# Patient Record
Sex: Female | Born: 1980
Health system: Southern US, Community
[De-identification: ages and names within clinical notes are randomized; demographics above are authoritative.]

## PROBLEM LIST (undated history)

## (undated) ENCOUNTER — Inpatient Hospital Stay (HOSPITAL_COMMUNITY): Payer: Self-pay

## (undated) DIAGNOSIS — I10 Essential (primary) hypertension: Secondary | ICD-10-CM

## (undated) DIAGNOSIS — F419 Anxiety disorder, unspecified: Secondary | ICD-10-CM

## (undated) DIAGNOSIS — IMO0001 Reserved for inherently not codable concepts without codable children: Secondary | ICD-10-CM

## (undated) DIAGNOSIS — F329 Major depressive disorder, single episode, unspecified: Secondary | ICD-10-CM

## (undated) DIAGNOSIS — K219 Gastro-esophageal reflux disease without esophagitis: Secondary | ICD-10-CM

## (undated) DIAGNOSIS — Z5189 Encounter for other specified aftercare: Secondary | ICD-10-CM

## (undated) DIAGNOSIS — Z8619 Personal history of other infectious and parasitic diseases: Secondary | ICD-10-CM

## (undated) DIAGNOSIS — D649 Anemia, unspecified: Secondary | ICD-10-CM

## (undated) DIAGNOSIS — F32A Depression, unspecified: Secondary | ICD-10-CM

## (undated) DIAGNOSIS — N911 Secondary amenorrhea: Secondary | ICD-10-CM

## (undated) HISTORY — DX: Encounter for other specified aftercare: Z51.89

## (undated) HISTORY — DX: Essential (primary) hypertension: I10

## (undated) HISTORY — PX: TUBAL LIGATION: SHX77

## (undated) HISTORY — DX: Secondary amenorrhea: N91.1

## (undated) HISTORY — DX: Depression, unspecified: F32.A

## (undated) HISTORY — DX: Major depressive disorder, single episode, unspecified: F32.9

## (undated) HISTORY — PX: BARTHOLIN CYST MARSUPIALIZATION: SHX5383

## (undated) HISTORY — DX: Anxiety disorder, unspecified: F41.9

---
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
## 2002-01-15 ENCOUNTER — Inpatient Hospital Stay (HOSPITAL_COMMUNITY): Admission: AD | Admit: 2002-01-15 | Discharge: 2002-01-15 | Payer: Self-pay | Admitting: *Deleted

## 2002-01-17 ENCOUNTER — Inpatient Hospital Stay (HOSPITAL_COMMUNITY): Admission: AD | Admit: 2002-01-17 | Discharge: 2002-01-17 | Payer: Self-pay | Admitting: Obstetrics and Gynecology

## 2002-03-30 ENCOUNTER — Inpatient Hospital Stay (HOSPITAL_COMMUNITY): Admission: AD | Admit: 2002-03-30 | Discharge: 2002-03-30 | Payer: Self-pay | Admitting: Obstetrics and Gynecology

## 2002-05-06 ENCOUNTER — Encounter (HOSPITAL_COMMUNITY): Admission: RE | Admit: 2002-05-06 | Discharge: 2002-05-06 | Payer: Self-pay | Admitting: *Deleted

## 2002-05-11 ENCOUNTER — Inpatient Hospital Stay (HOSPITAL_COMMUNITY): Admission: AD | Admit: 2002-05-11 | Discharge: 2002-05-15 | Payer: Self-pay | Admitting: Obstetrics and Gynecology

## 2002-05-17 ENCOUNTER — Inpatient Hospital Stay (HOSPITAL_COMMUNITY): Admission: AD | Admit: 2002-05-17 | Discharge: 2002-05-17 | Payer: Self-pay | Admitting: Obstetrics and Gynecology

## 2006-09-17 ENCOUNTER — Ambulatory Visit: Payer: Self-pay | Admitting: *Deleted

## 2007-07-29 ENCOUNTER — Ambulatory Visit: Payer: Self-pay | Admitting: Family Medicine

## 2007-08-09 ENCOUNTER — Ambulatory Visit: Payer: Self-pay | Admitting: Family Medicine

## 2007-08-09 ENCOUNTER — Ambulatory Visit (HOSPITAL_COMMUNITY): Admission: RE | Admit: 2007-08-09 | Discharge: 2007-08-09 | Payer: Self-pay | Admitting: Family Medicine

## 2007-08-27 ENCOUNTER — Ambulatory Visit: Payer: Self-pay | Admitting: Family Medicine

## 2008-04-14 HISTORY — PX: DENTAL SURGERY: SHX609

## 2009-08-15 ENCOUNTER — Emergency Department (HOSPITAL_COMMUNITY): Admission: EM | Admit: 2009-08-15 | Discharge: 2009-08-15 | Payer: Self-pay | Admitting: Emergency Medicine

## 2009-10-02 ENCOUNTER — Inpatient Hospital Stay (HOSPITAL_COMMUNITY): Admission: AD | Admit: 2009-10-02 | Discharge: 2009-10-02 | Payer: Self-pay | Admitting: Obstetrics & Gynecology

## 2009-10-02 ENCOUNTER — Encounter: Payer: Self-pay | Admitting: Family Medicine

## 2009-10-02 ENCOUNTER — Ambulatory Visit: Payer: Self-pay | Admitting: Obstetrics & Gynecology

## 2009-10-23 ENCOUNTER — Ambulatory Visit (HOSPITAL_COMMUNITY): Admission: RE | Admit: 2009-10-23 | Discharge: 2009-10-23 | Payer: Self-pay | Admitting: Family Medicine

## 2009-10-23 ENCOUNTER — Encounter: Payer: Self-pay | Admitting: Family Medicine

## 2009-11-13 ENCOUNTER — Ambulatory Visit (HOSPITAL_COMMUNITY): Admission: RE | Admit: 2009-11-13 | Discharge: 2009-11-13 | Payer: Self-pay | Admitting: Family Medicine

## 2009-11-28 ENCOUNTER — Encounter: Payer: Self-pay | Admitting: Family Medicine

## 2009-11-28 ENCOUNTER — Ambulatory Visit (HOSPITAL_COMMUNITY): Admission: RE | Admit: 2009-11-28 | Discharge: 2009-11-28 | Payer: Self-pay | Admitting: Family Medicine

## 2010-01-26 ENCOUNTER — Inpatient Hospital Stay (HOSPITAL_COMMUNITY): Admission: AD | Admit: 2010-01-26 | Discharge: 2010-01-26 | Payer: Self-pay | Admitting: Family Medicine

## 2010-01-26 ENCOUNTER — Ambulatory Visit: Payer: Self-pay | Admitting: Advanced Practice Midwife

## 2010-04-18 ENCOUNTER — Inpatient Hospital Stay (HOSPITAL_COMMUNITY)
Admission: AD | Admit: 2010-04-18 | Discharge: 2010-04-18 | Payer: Self-pay | Source: Home / Self Care | Attending: Obstetrics & Gynecology | Admitting: Obstetrics & Gynecology

## 2010-04-19 LAB — CBC
HCT: 32.6 % — ABNORMAL LOW (ref 36.0–46.0)
Hemoglobin: 11 g/dL — ABNORMAL LOW (ref 12.0–15.0)
MCH: 29.6 pg (ref 26.0–34.0)
MCHC: 33.7 g/dL (ref 30.0–36.0)
MCV: 87.6 fL (ref 78.0–100.0)
Platelets: 227 10*3/uL (ref 150–400)
RBC: 3.72 MIL/uL — ABNORMAL LOW (ref 3.87–5.11)
RDW: 13.6 % (ref 11.5–15.5)
WBC: 12.5 10*3/uL — ABNORMAL HIGH (ref 4.0–10.5)

## 2010-04-25 ENCOUNTER — Inpatient Hospital Stay: Admission: RE | Admit: 2010-04-25 | Payer: Self-pay | Source: Home / Self Care | Admitting: Obstetrics and Gynecology

## 2010-04-25 ENCOUNTER — Inpatient Hospital Stay (HOSPITAL_COMMUNITY)
Admission: RE | Admit: 2010-04-25 | Discharge: 2010-04-28 | Payer: Self-pay | Source: Home / Self Care | Attending: Obstetrics and Gynecology | Admitting: Obstetrics and Gynecology

## 2010-04-29 LAB — CBC
HCT: 24.9 % — ABNORMAL LOW (ref 36.0–46.0)
Hemoglobin: 8.4 g/dL — ABNORMAL LOW (ref 12.0–15.0)
MCH: 29.9 pg (ref 26.0–34.0)
MCHC: 33.7 g/dL (ref 30.0–36.0)
MCV: 88.6 fL (ref 78.0–100.0)
Platelets: 173 10*3/uL (ref 150–400)
RBC: 2.81 MIL/uL — ABNORMAL LOW (ref 3.87–5.11)
RDW: 14.1 % (ref 11.5–15.5)
WBC: 12.3 10*3/uL — ABNORMAL HIGH (ref 4.0–10.5)

## 2010-04-29 LAB — SURGICAL PCR SCREEN
MRSA, PCR: NEGATIVE
Staphylococcus aureus: NEGATIVE

## 2010-04-29 LAB — ABO/RH: ABO/RH(D): O POS

## 2010-04-29 LAB — RPR: RPR Ser Ql: NONREACTIVE

## 2010-05-06 NOTE — Discharge Summary (Signed)
  [name], [name]               ACCOUNT NO.:  [id number]  MEDICAL RECORD NO.:  [id number]          PATIENT TYPE:  INP  LOCATION:  9134                          [facility]:  [facility]  PHYSICIAN:  Sherry [name], M.D.   DATE OF BIRTH:  1981/01/12  DATE OF ADMISSION:  04/25/2010 DATE OF DISCHARGE:  04/28/2010                              DISCHARGE SUMMARY   Attending physician at arrival is Dr. Catalina Antigua.  ADMISSION DIAGNOSES: 1. Intrauterine pregnancy at 39 weeks. 2. Previous cesarean section with desire for repeat.  DISCHARGE DIAGNOSES: 1. Status post repeat low transverse cervical cesarean section. 2. Term female infant delivered.  PERTINENT LABS:  Hemoglobin 8.4 and hematocrit 24.9 on April 26, 2010.  BRIEF HOSPITAL COURSE:  This is a 30 year old G2, P 1-0-0-1 at 39 weeks. She was admitted for repeat scheduled low transverse C-section.  The procedure was uncomplicated with EBL of 900 mL.  The patient delivered a viable female infant weighing 8 pounds and 14 ounces with Apgars of 8 and 9 respectively.  The newborn had no complications and will be discharged with the patient on postop day number 3.  Peri and postoperative course was uncomplicated with pain control being achieved on oral Percocet and Motrin.  The patient plans to bottle feed her infant and will receive a Depo-Provera injection prior to discharge for desired contraception. She may pursue more permanent options at her followup appointment.  She will have her staples removed from surgical incision by home health nursing 1-2 days after discharge.  DISCHARGE DISPOSITION:  The patient will be discharged to home in stable medical condition.  DISCHARGE MEDICATIONS: 1. Percocet 5/325 mg p.o. q.4-6 h p.r.n. 2. Motrin 600 mg p.o. q.6 h. p.r.n. 3. Colace 100 mg b.i.d. p.r.n. 4. Iron sulfate 325 mg p.o. daily.  DISCHARGE FOLLOWUP:  [facility]  Attending  physician at the time of discharge, Dr. Tinnie Gens.    ______________________________ Lloyd Huger, MD   ______________________________ Shelbie Proctor. Shawnie [name], M.D.    Cleotis Lema  D:  04/28/2010  T:  04/29/2010  Job:  [id number]  Electronically Signed by Lloyd Huger MD on 05/03/2010 07:22:23 PM Electronically Signed by Tinnie Gens M.D. on 05/06/2010 12:08:35 PM

## 2010-06-26 LAB — URINALYSIS, ROUTINE W REFLEX MICROSCOPIC
Bilirubin Urine: NEGATIVE
Glucose, UA: NEGATIVE mg/dL
Hgb urine dipstick: NEGATIVE
Ketones, ur: NEGATIVE mg/dL
Nitrite: POSITIVE — AB
Protein, ur: 30 mg/dL — AB
Specific Gravity, Urine: 1.025 (ref 1.005–1.030)
Urobilinogen, UA: 0.2 mg/dL (ref 0.0–1.0)
pH: 6 (ref 5.0–8.0)

## 2010-06-26 LAB — URINE MICROSCOPIC-ADD ON

## 2010-06-26 LAB — GLUCOSE, CAPILLARY: Glucose-Capillary: 102 mg/dL — ABNORMAL HIGH (ref 70–99)

## 2010-06-26 LAB — URINE CULTURE
Colony Count: 50000
Culture  Setup Time: 201110152059

## 2010-06-30 LAB — URINALYSIS, ROUTINE W REFLEX MICROSCOPIC
Bilirubin Urine: NEGATIVE
Glucose, UA: NEGATIVE mg/dL
Hgb urine dipstick: NEGATIVE
Ketones, ur: NEGATIVE mg/dL
Nitrite: NEGATIVE
Protein, ur: NEGATIVE mg/dL
Specific Gravity, Urine: >1.03 — ABNORMAL HIGH (ref 1.005–1.030)
Urobilinogen, UA: 0.2 mg/dL (ref 0.0–1.0)
pH: 6 (ref 5.0–8.0)

## 2010-06-30 LAB — WET PREP, GENITAL
Trich, Wet Prep: NONE SEEN
Yeast Wet Prep HPF POC: NONE SEEN

## 2010-06-30 LAB — GC/CHLAMYDIA PROBE AMP, GENITAL
Chlamydia, DNA Probe: NEGATIVE
GC Probe Amp, Genital: NEGATIVE

## 2010-08-27 NOTE — Group Therapy Note (Signed)
NAME:  LORRY, FURBER NO.:  [id number]   MEDICAL RECORD NO.:  [id number]          PATIENT TYPE:  WOC   LOCATION:  [facility]                   FAC[facility]   PHYSICIAN:  Ellis Parents, MD    DATE OF BIRTH:  Apr 23, 1980   DATE OF SERVICE:  09/17/2006                                  CLINIC NOTE   This 30 year old gravida 1, para 1 is referred from [facility] for  evaluation of an asymptomatic Bartholin's duct cyst.  The patient states  that she had developed this cyst approximately 1 year ago, swelling,  some moderate pain in the area but the pain disappeared and the cyst has  remained present but totally asymptomatic.  The patient denies any  anterior dyspareunia and the cyst has not changed in size.   Examination reveals a small Bartholin's duct cyst on the left side,  measuring approximately 2 x 2 cm and nontender and non-nodular and non-  indurated.  The patient was advised if this cyst remains stable in size  and asymptomatic that there is no indication to do any procedures or  operation on this particular cyst.           ______________________________  Ellis Parents, MD     SA/MEDQ  D:  09/17/2006  T:  09/17/2006  Job:  [id number]

## 2010-08-27 NOTE — Group Therapy Note (Signed)
NAME:  Sherry [name], Sherry [name] NO.:  [id number]   MEDICAL RECORD NO.:  [id number]          PATIENT TYPE:  WOC   LOCATION:  [facility]                   [facility]   PHYSICIAN:  Tinnie Gens, MD        DATE OF BIRTH:  Jul 02, 1980   DATE OF SERVICE:  07/29/2007                                  CLINIC NOTE   CHIEF COMPLAINT:  Bartholin's gland cyst.   HISTORY OF PRESENT ILLNESS:  The patient is a 30 year old gravida 1,  para 1 is referred from [facility] for treatment of Bartholin's gland  cyst.  She has previously been seen for this approximately 1 year ago  and it has gotten bigger and more painful, and she would like definitive  treatment for it now.   PAST MEDICAL HISTORY:  Negative.   PAST SURGICAL HISTORY:  C section.   MEDICATIONS:  Multivitamin and Ortho Evra.   ALLERGIES:  None known.  No latex allergy.   OBSTETRICAL HISTORY:  She is a G1 P1 with 1 previous section for arrest  her rest of 7 cm.   GYN HISTORY:  Menarche at age 19.  Cycles are monthly.  Last for 5 days  with mild cramping.  She does use Ortho Evra and condoms for birth  control.  She does have a history of abnormal Pap.  She is status post  colposcopy in April of this year.   FAMILY HISTORY:  Significant for hypertension.   SOCIAL HISTORY:  She lives with her son, Sherry [name].  She is an  occasional alcohol user.  Denies tobacco or other drug use.   14 POINT REVIEW OF SYSTEMS:  Reviewed and is negative.   EXAM:  Today her vitals are as in the chart.  She is a well-nourished female in no acute distress.  ABDOMEN:  Soft, nontender, nondistended.  GU:  Normal vulva and perineum.  She has a 2.5x3.5 cm Bartholin's on the  left.  Not exquisitely tender but this is somewhat tender to palpation.  There is no significant erythema or induration noted.   IMPRESSION:  Left Bartholin's cyst.   PLAN:  Marsupialization in the OR.  Patient discussed risks benefits of  this and agrees to  proceed.           ______________________________  Tinnie Gens, MD     TP/MEDQ  D:  07/29/2007  T:  07/29/2007  Job:  [id number]

## 2010-08-27 NOTE — Op Note (Signed)
NAMEDELORISE, HUNKELE               ACCOUNT NO.:  [id number]   MEDICAL RECORD NO.:  [id number]          PATIENT TYPE:  AMB   LOCATION:  SDC                           [facility]   PHYSICIAN:  Tanya S. Shawnie Pons, M.D.   DATE OF BIRTH:  11-Oct-1980   DATE OF PROCEDURE:  08/09/2007  DATE OF DISCHARGE:                               OPERATIVE REPORT   PREOPERATIVE DIAGNOSIS:  Recurrent Bartholin's gland cyst on the left.   POSTOPERATIVE DIAGNOSIS:  Recurrent Bartholin's gland cyst on the left.   PROCEDURE:  Left Bartholin's marsupialization.   SURGEON:  Shelbie Proctor. Shawnie Pons, M.D.   ASSISTANT:  None.   ANESTHESIA:  MAC and local.   FINDINGS:  Clear fluid-filled cyst.   SPECIMENS:  None.   ESTIMATED BLOOD LOSS:  Minimal.   COMPLICATIONS:  None known.   INDICATIONS FOR PROCEDURE:  The patient is a 30 year old G1, P1 who has  a 1-year history of Bartholin's gland cyst.  She has had an I&D with the  Word catheter several times, but this has not prevented recurrence.  It  has gotten larger and more painful, and she desires definitive  treatment.   DESCRIPTION OF PROCEDURE:  The patient was taken to the OR.  She was  placed in dorsal lithotomy in Fouke stirrups.  She was prepped and  draped in the usual sterile fashion.  3 mL of 1% lidocaine plain was  then injected over the Bartholin's gland on the labia minora, so as to  not be on the perineum where the patient sits.   A 1-cm incision was made with the knife and carried down to the  underlying cyst wall which was incised.  The cyst wall was then sutured  to the labia minora in a ring fashion.  Excellent hemostasis was noted.  More clear fluid was drained out of the cyst as well as some mucousy-  type discharge that was noted.  All of this was drained.  The  Bartholin's was flat and the labia flat at the end of the procedure.  All instrument, needle, and lap counts were correct x2.   The patient was awakened and taken to the recovery  room in stable  condition.      Shelbie Proctor. Shawnie Pons, M.D.  Electronically Signed     TSP/MEDQ  D:  08/09/2007  T:  08/09/2007  Job:  [id number]

## 2010-08-30 NOTE — Op Note (Signed)
NAME:  Sherry [name], Sherry [name]                         ACCOUNT NO.:  [id number]   MEDICAL RECORD NO.:  [id number]                   PATIENT TYPE:  INP   LOCATION:  9146                                 [facility]   PHYSICIAN:  Mary Sella. Orlene Erm, M.D.                 DATE OF BIRTH:  [birth year]   DATE OF PROCEDURE:  DATE OF DISCHARGE:  05/12/2002                                 OPERATIVE REPORT   PREOPERATIVE DIAGNOSIS:  A 30 year old, gravida 1, para 0 at 41 weeks  intrauterine pregnancy with arrest of dilatation.   POSTOPERATIVE DIAGNOSIS:  A 30 year old, gravida 1, para 0 at 41 weeks  intrauterine pregnancy with arrest of dilatation.   PROCEDURE:  Primary low transverse cesarean section.   SURGEON:  Enid Cutter, M.D.   ANESTHESIA:  Epidural.   COMPLICATIONS:  None.   SPECIMENS:  Cord blood.   FINDINGS:  Viable female infant delivered at 2046, Apgars 8 and 9, weighing 7  pounds 13 ounces.  Normal uterus, tubes  ovaries.   ESTIMATED BLOOD LOSS:  800 mL.   DISPOSITION:  The patient went to the recovery room stable.   INDICATIONS FOR PROCEDURE:  The patient is a 30 year old gravida 1, para 0  at 51 weeks' gestation who was begun on induction secondary to post dates.  She underwent induction with Cervidil and progressed to 3 cm.  Artificial  rupture of membranes was performed, and the patient was given Pitocin.  She  progressed to 4 cm and an IUPC was placed.  The Pitocin was increased to  adequate labor.  She progressed to 6-7 cm very slowly throughout the day.  She finally arrested at 6-7 cm after multiple hours of adequate labor.  The  patient was counseled on risks of cesarean section, including risks of  bleeding and damage to internal organs, transfusions or need for emergent  hysterectomy.  The patient understands these risks and wishes to proceed.   DESCRIPTION OF PROCEDURE:  The patient was taken to the operating room where  her epidural anesthesia was bolused.  She was  prepped and draped in the  usual sterile fashion.  A Pfannenstiel incision was performed with the  scalpel and carried to the underlying fascia.  The fascia was entered  sharply and dissected laterally with Mayo scissors.  The fascia was  separated from the underlying rectus muscle bellies with sharp and blunt  dissection.  The peritoneum was entered bluntly and stretched with the  surgeon's fingers.  The bladder blade was placed.  The bladder flap was  created with sharp and blunt dissection.  A scalpel was used to score the  uterus and the incision was carried down to the midline with a scalpel.  The  surgeon's fingers were easily entered into the uterine cavity, and the  incision was extended with the surgeon's finger's.  The infant's head was  then delivered through  the uterine incision and bulb suctioned on the  operative field.  The infant's body was delivered, and the cord was clamped  and cut.  The infant was handed off to the awaiting neonatal resuscitation  team.  The uterus was massaged, and the placenta was extracted. The uterus  was curetted with a dry lap sponge.  The uterine incision was repaired with  a running locking stitch of 0 chromic.  A second imbricating layered suture  was performed.  The uterine incision was inspected and noted to be  hemostatic.  The uterus was returned to the abdominal cavity and pericolic  gutters cleared of clot and debris.  The uterine incision was once again  noted to be hemostatic.  The fascia was then closed with a running suture of  0 Vicryl.  The subcutaneous tissues were irrigated with copious amounts of  saline and the subcutaneous tissues were reapproximated with staples.  The  patient tolerated the procedure well and returned to the recovery room in  stable condition.  Sponge, instrument and needle counts were correct at the  end of the procedure.                                               Mary Sella. Orlene Erm, M.D.    EMH/MEDQ  D:   05/12/2002  T:  05/13/2002  Job:  [id number]

## 2010-08-30 NOTE — Discharge Summary (Signed)
NAME:  Sherry [name], Sherry [name] NO.:  [id number]   MEDICAL RECORD NO.:  [id number]                   PATIENT TYPE:   LOCATION:                                       FACILITY:   PHYSICIAN:  Phil D. Okey Dupre, M.D.                  DATE OF BIRTH:  1980-12-07   DATE OF ADMISSION:  05/11/2002  DATE OF DISCHARGE:  05/15/2002                                 DISCHARGE SUMMARY   DISCHARGE DIAGNOSES:  1. Intrauterine pregnancy at 41 weeks - delivered viable female infant.  2. Induction of labor for post term.  3. Status post low transverse cesarean section.  4. Anemia with a postoperative hemoglobin of 8.9 on discharge.  5. Depression.   DISCHARGE MEDICATIONS:  1. Iron 325 mg p.o. q. daily.  2. Depo-Provera 150 mg IM times one given prior to discharge.  3. Celexa 20 mg p.o. q. daily.   PROCEDURES WHILE IN HOSPITAL:  1. Low transverse cesarean section performed by Dr. Shearon Balo.  For full     dictation of that please see his note.   FOLLOW UP:  The patient is to follow-up in two days at the [facility] for staple  removal and then in six weeks at [facility].   HOSPITAL COURSE AND PRESENTING ILLNESS:  This is a 30 year-old [place] at  41 weeks and 0 days gestation who presented for induction of labor secondary  to post dates.  The pregnancy was dated by an 18 week ultrasound and last  menstrual period.  The pregnancy was followed at [facility] with onset of  care at 15 weeks and was complicated by depression and initial Chlamydia  positive on presentation.   MEDICATIONS:  1. Prenatal vitamins.  2. Celexa.   PAST MEDICAL HISTORY:  1. Depression.  2. Goiter surgically removed.   PRENATAL LABORATORIES:  The patient was blood type O positive and antibody  negative.  Initial hematocrit was 32.2 and platelets of 264,000.  The  patient was [place] non-immune.  Hepatitis B surface antigen was negative.  Syphilis, HIV and GC all negative.  Chlamydia initially  positive  in July  2003 and negative in November 2003.  The patient was GBS negative on  February 16, 2002 when RTT was 55.   PHYSICAL EXAMINATION ON PRESENTATION:  GENERAL:  Generally pleasant and in  no acute distress.  Physical examination was within normal limits.  PELVIC:  Digital cervical exam was 1 cm, 50% and -2.  Fetal heart tones at  baseline was 140's, reactive with good variability and occasional variable  decelerations.  The patient was contracting every two to four minutes.  Therefore, the patient was admitted for induction of labor.   HOSPITAL COURSE:  Labor was induced with Cervidil which was placed on  January 28 and then on the morning of January 29 the patient was begun on  Pitocin  and on January 29 at 7:40 p.m. got to a dilation of 6 cm and did not  dilate any further.  Therefore, it was decided due to arrestive descent the  patient was brought back for a low transverse cesarean section and gave  birth to a female infant weighing 7 pounds 13 ounces with Apgars of 8 and 9.  The patient had an uncomplicated postoperative course and was discharged  home on May 15, 2002.     Bradly Bienenstock, M.D.                         Phil D. Okey Dupre, M.D.    Arliss Journey  D:  05/15/2002  T:  05/15/2002  Job:  [id number]

## 2011-01-07 LAB — PREGNANCY, URINE: Preg Test, Ur: NEGATIVE

## 2011-01-07 LAB — CBC
HCT: 37.3
Hemoglobin: 12.9
MCHC: 34.5
MCV: 88.9
Platelets: 274
RBC: 4.19
RDW: 13
WBC: 9.2

## 2012-08-19 ENCOUNTER — Encounter: Payer: Self-pay | Admitting: Family Medicine

## 2012-08-19 ENCOUNTER — Ambulatory Visit (INDEPENDENT_AMBULATORY_CARE_PROVIDER_SITE_OTHER): Payer: 59 | Admitting: Family Medicine

## 2012-08-19 VITALS — BP 140/80 | Temp 98.2°F | Ht 66.0 in | Wt 183.0 lb

## 2012-08-19 DIAGNOSIS — F418 Other specified anxiety disorders: Secondary | ICD-10-CM

## 2012-08-19 DIAGNOSIS — IMO0001 Reserved for inherently not codable concepts without codable children: Secondary | ICD-10-CM

## 2012-08-19 DIAGNOSIS — Z7189 Other specified counseling: Secondary | ICD-10-CM

## 2012-08-19 DIAGNOSIS — Z7689 Persons encountering health services in other specified circumstances: Secondary | ICD-10-CM

## 2012-08-19 DIAGNOSIS — Z309 Encounter for contraceptive management, unspecified: Secondary | ICD-10-CM

## 2012-08-19 DIAGNOSIS — F341 Dysthymic disorder: Secondary | ICD-10-CM

## 2012-08-19 MED ORDER — CITALOPRAM HYDROBROMIDE 20 MG PO TABS: 20.0000 mg | ORAL_TABLET | Freq: Every day | ORAL | Status: DC

## 2012-08-19 MED ORDER — NORELGESTROMIN-ETH ESTRADIOL 150-35 MCG/24HR TD PTWK: 1.0000 | MEDICATED_PATCH | TRANSDERMAL | Status: DC

## 2012-08-19 NOTE — Progress Notes (Signed)
Chief Complaint  Patient presents with  . Establish Care    HPI:  Sherry [name] is here to establish care. [facility] gyn but decided to get PCP when got insurance. Last PCP and physical: Feb 2013 - normal, had abnormal in the past, but all normal since.  Has the following chronic problems and concerns today:  There are no active problems to display for this patient.  Depression and Anxiety: -depressed mood almost every day with some generalized anxiety as well -no recent bad situations, but work is a little stressful -no thoughts of suicide or self harm -a long time ago was in hospital briefly for depression as a teenager due to thoughs of self harm - none since -was on celexa and this was controlling her symptoms, but ran out a few months ago and has tried calling the psychiatrist at the health department, but they have not gotten back to her -she will call to start counseling back up  Contraception: -used to use ortho-evra - but ran out -wants to restart -periods are regular and normal -FDLMP: 07/28/12  Health Maintenance:  ROS: See pertinent positives and negatives per HPI.  Past Medical History  Diagnosis Date  . Depression     Family History  Problem Relation Age of Onset  . Diabetes Mother   . Hypertension Mother   . Depression Father   . Diabetes Maternal Aunt     History   Social History  . Marital Status: Married    Spouse Name: N/A    Number of Children: N/A  . Years of Education: N/A   Social History Main Topics  . Smoking status: Never Smoker   . Smokeless tobacco: None  . Alcohol Use: Yes     Comment: occ - 1 - 2 drinks every other weekend  . Drug Use: No  . Sexually Active: Yes     Comment: with husband   Other Topics Concern  . 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: living with husband and son 20 and daughter 2 (in  2014)      Spiritual Beliefs: christian      Lifestyle: no regular exercise, diet is not good             Current outpatient prescriptions:Multiple Vitamins-Minerals (ALIVE WOMENS ENERGY PO), Take 1 tablet by mouth daily., Disp: , Rfl: ;  citalopram (CELEXA) 20 MG tablet, Take 1 tablet (20 mg total) by mouth daily., Disp: 30 tablet, Rfl: 3;  norelgestromin-ethinyl estradiol (ORTHO EVRA) 150-20 MCG/24HR transdermal patch, Place 1 patch onto the skin once a week., Disp: 3 patch, Rfl: 12  EXAM:  Filed Vitals:   08/19/12 1603  BP: 140/80  Temp: 98.2 F (36.8 C)    Body mass index is 29.55 kg/(m^2).  GENERAL: vitals reviewed and listed above, alert, oriented, appears well hydrated and in no acute distress  HEENT: atraumatic, conjunttiva clear, no obvious abnormalities on inspection of external nose and ears  NECK: no obvious masses on inspection  LUNGS: clear to auscultation bilaterally, no wheezes, rales or rhonchi, good air movement  CV: HRRR, no peripheral edema  MS: moves all extremities without noticeable abnormality  PSYCH: pleasant and cooperative, no obvious depression or anxiety  ASSESSMENT AND PLAN:  Discussed the following assessment and plan:  Depression with anxiety - Plan: citalopram (CELEXA) 20 MG tablet  Contraception - Plan: norelgestromin-ethinyl estradiol (ORTHO EVRA) 150-20 MCG/24HR transdermal patch  Encounter to establish care - Plan: Lipid Panel, Hemoglobin A1c -We reviewed the PMH, PSH, FH, SH, Meds and Allergies. -We provided refills for any medications we will prescribe as needed. -We addressed current concerns per orders and patient instructions. -advised counseling and will restart celexa for depression/anxiety - discussed risks -urine preg neg - quick start restart ortho-evra - risks discussed -NON-FASTING LABS -follow up for physical with pap in 1 month   -Patient advised to return or notify a doctor immediately if symptoms worsen or  persist or new concerns arise.  Patient Instructions  -We have ordered labs or studies at this visit. It can take up to 1-2 weeks for results and processing. We will contact you with instructions IF your results are abnormal. Normal results will be released to your [facility]. If you have not heard from [place] or can not find your results in [facility] in 2 weeks please contact our office.  -PLEASE SIGN UP FOR MYCHART TODAY   -As we discussed, we have prescribed a new medication for you at this appointment. We discussed the common and serious potential adverse effects of this medication and you can review these and more with the pharmacist when you pick up your medication.  Please follow the instructions for use carefully and notify us immediately if you have any problems taking this medication.  We recommend the following healthy lifestyle measures: - eat a healthy diet consisting of lots of vegetables, fruits, beans, nuts, seeds, healthy meats such as white chicken and fish and whole grains.  - avoid fried foods, fast food, processed foods, sodas, red meet and other fattening foods.  - get a least 150 minutes of aerobic exercise per week.   Follow up in: 1 month for physical with pap smear      KIM, HANNAH R.

## 2012-08-19 NOTE — Patient Instructions (Signed)
-  We have ordered labs or studies at this visit. It can take up to 1-2 weeks for results and processing. We will contact you with instructions IF your results are abnormal. Normal results will be released to your [facility]. If you have not heard from [place] or can not find your results in [facility] in 2 weeks please contact our office.  -PLEASE SIGN UP FOR MYCHART TODAY   -As we discussed, we have prescribed a new medication for you at this appointment. We discussed the common and serious potential adverse effects of this medication and you can review these and more with the pharmacist when you pick up your medication.  Please follow the instructions for use carefully and notify us immediately if you have any problems taking this medication.  We recommend the following healthy lifestyle measures: - eat a healthy diet consisting of lots of vegetables, fruits, beans, nuts, seeds, healthy meats such as white chicken and fish and whole grains.  - avoid fried foods, fast food, processed foods, sodas, red meet and other fattening foods.  - get a least 150 minutes of aerobic exercise per week.   Follow up in: 1 month for physical with pap smear

## 2012-09-16 ENCOUNTER — Other Ambulatory Visit (HOSPITAL_COMMUNITY)
Admission: RE | Admit: 2012-09-16 | Discharge: 2012-09-16 | Disposition: A | Discharge status: Home / Self Care | Payer: 59 | Source: Ambulatory Visit | Attending: Family Medicine | Admitting: Family Medicine

## 2012-09-16 ENCOUNTER — Ambulatory Visit (INDEPENDENT_AMBULATORY_CARE_PROVIDER_SITE_OTHER): Payer: 59 | Admitting: Family Medicine

## 2012-09-16 ENCOUNTER — Encounter: Payer: Self-pay | Admitting: Family Medicine

## 2012-09-16 VITALS — BP 126/86 | Temp 98.2°F | Wt 177.0 lb

## 2012-09-16 DIAGNOSIS — A64 Unspecified sexually transmitted disease: Secondary | ICD-10-CM

## 2012-09-16 DIAGNOSIS — IMO0002 Reserved for concepts with insufficient information to code with codable children: Secondary | ICD-10-CM

## 2012-09-16 DIAGNOSIS — Z0000 Encounter for general adult medical examination without abnormal findings: Secondary | ICD-10-CM

## 2012-09-16 DIAGNOSIS — Z01419 Encounter for gynecological examination (general) (routine) without abnormal findings: Secondary | ICD-10-CM | POA: Insufficient documentation

## 2012-09-16 DIAGNOSIS — Z309 Encounter for contraceptive management, unspecified: Secondary | ICD-10-CM

## 2012-09-16 DIAGNOSIS — Z131 Encounter for screening for diabetes mellitus: Secondary | ICD-10-CM

## 2012-09-16 DIAGNOSIS — F341 Dysthymic disorder: Secondary | ICD-10-CM

## 2012-09-16 DIAGNOSIS — Z13220 Encounter for screening for lipoid disorders: Secondary | ICD-10-CM

## 2012-09-16 DIAGNOSIS — Z1151 Encounter for screening for human papillomavirus (HPV): Secondary | ICD-10-CM | POA: Insufficient documentation

## 2012-09-16 DIAGNOSIS — F418 Other specified anxiety disorders: Secondary | ICD-10-CM

## 2012-09-16 DIAGNOSIS — IMO0001 Reserved for inherently not codable concepts without codable children: Secondary | ICD-10-CM

## 2012-09-16 DIAGNOSIS — R87810 Cervical high risk human papillomavirus (HPV) DNA test positive: Secondary | ICD-10-CM | POA: Insufficient documentation

## 2012-09-16 DIAGNOSIS — Z113 Encounter for screening for infections with a predominantly sexual mode of transmission: Secondary | ICD-10-CM | POA: Insufficient documentation

## 2012-09-16 DIAGNOSIS — N760 Acute vaginitis: Secondary | ICD-10-CM | POA: Insufficient documentation

## 2012-09-16 LAB — LIPID PANEL
HDL: 49.3 mg/dL (ref >39.00)
Total CHOL/HDL Ratio: 3
Triglycerides: 133 mg/dL (ref 0.0–149.0)
VLDL: 26.6 mg/dL (ref 0.0–40.0)

## 2012-09-16 LAB — HEMOGLOBIN A1C: Hgb A1c MFr Bld: 5.3 % (ref 4.6–6.5)

## 2012-09-16 MED ORDER — CITALOPRAM HYDROBROMIDE 40 MG PO TABS: 40.0000 mg | ORAL_TABLET | Freq: Every day | ORAL | Status: DC

## 2012-09-16 NOTE — Progress Notes (Signed)
Chief Complaint  Patient presents with  . Annual Exam    HPI:  Here for CPE:  -Concerns today:  1)Depression: -started celexa last visit -feeling much better over all, does have a few days a week still where feels a bit doen if does not keep busy  2)Contrception -started orthoevra last visit -discussed potential for decreased effectives with antidepressant and need for condoms  -Diet: variety of foods, balance and well rounded  -Taking folic acid: taking multivit with folic acid  -Exercise: no regular exercise  -Diabetes and Dyslipidemia Screening: did not do last visit  -Hx of HTN: no  -Vaccines: UTD  -pap history: 05/2011 per report - normal  -FDLMP: about 1 month ago  -sexual activity: yes, female partner, no new partners  -wants STI testing: yes, no new partners or concerns  -does self breast exams  -Alcohol, Tobacco, drug use: see social history  Review of Systems - CP, SOB, DOE, palpitations, dizziness, change sin bowels, dysuria, vaginal discharge, abnormal bleeding, rashes, weight loss, fevers  Past Medical History  Diagnosis Date  . Depression     Family History  Problem Relation Age of Onset  . Diabetes Mother   . Hypertension Mother   . Depression Father   . Diabetes Maternal Aunt     History   Social History  . Marital Status: Married    Spouse Name: N/A    Number of Children: N/A  . Years of Education: N/A   Social History Main Topics  . Smoking status: Never Smoker   . Smokeless tobacco: None  . Alcohol Use: Yes     Comment: occ - 1 - 2 drinks every other weekend  . Drug Use: No  . Sexually Active: Yes     Comment: with husband   Other Topics Concern  . 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: living with husband and son 63 and daughter 2 (in 2014)      Spiritual Beliefs: christian      Lifestyle: no regular exercise, diet is not good              Current outpatient prescriptions:citalopram (CELEXA) 40 MG tablet, Take 1 tablet (40 mg total) by mouth daily., Disp: 30 tablet, Rfl: 3;  Multiple Vitamins-Minerals (ALIVE WOMENS ENERGY PO), Take 1 tablet by mouth daily., Disp: , Rfl: ;  norelgestromin-ethinyl estradiol (ORTHO EVRA) 150-20 MCG/24HR transdermal patch, Place 1 patch onto the skin once a week., Disp: 3 patch, Rfl: 12  EXAM:  Filed Vitals:   09/16/12 0943  BP: 126/86  Temp: 98.2 F (36.8 C)    GENERAL: vitals reviewed and listed below, alert, oriented, appears well hydrated and in no acute distress  HEENT: head atraumatic, PERRLA, normal appearance of eyes, ears, nose and mouth. moist mucus membranes.  NECK: supple, no masses or lymphadenopathy  LUNGS: clear to auscultation bilaterally, no rales, rhonchi or wheeze  CV: HRRR, no peripheral edema or cyanosis, normal pedal pulses  BREAST: normal appearance - no lesions or discharge, on palpation normal breast tissue without any suspicious masses  ABDOMEN: bowel sounds normal, soft, non tender to palpation, no masses, no rebound or guarding  GU: normal appearance of external genitalia - no lesions or masses, normal vaginal mucosa - no abnormal discharge, normal appearance of cervix - no lesions or abnormal discharge, no masses or tenderness on palpation of uterus and ovaries.  RECTAL: refused  SKIN: no rash or abnormal lesions  MS: normal gait, moves all extremities normally  NEURO: CN II-XII grossly intact, normal muscle strength and sensation to light touch on extremities  PSYCH: normal affect, pleasant and cooperative  ASSESSMENT AND PLAN:  Discussed the following assessment and plan:  Depression with anxiety - Plan: citalopram (CELEXA) 40 MG tablet -wants to try increasing dose of celexa - risks/interactions discussed  Encounter for preventive health examination  Contraception  Diabetes mellitus screening - Plan: Hemoglobin A1c  Screening for  hyperlipidemia - Plan: Lipid Panel  Venereal disease screening - Plan: HIV Antibody, RPR, GC/Chlam, HIV, RPR, trich  -Discussed and advised all [place] preventive services health task force level A and B recommendations for age, sex and risks.  -NON FASTING LABS today  -Advised at least 150 minutes of exercise per week and a healthy diet low in saturated fats and sweets and consisting of fresh fruits and vegetables, lean meats such as fish and white chicken and whole grains.  -labs, studies and vaccines per orders this encounter  Orders Placed This Encounter  Procedures  . Lipid Panel  . Hemoglobin A1c  . HIV Antibody  . RPR    Patient Instructions  -We have ordered labs or studies at this visit. It can take up to 1-2 weeks for results and processing. We will contact you with instructions IF your results are abnormal. Normal results will be released to your [facility]. If you have not heard from [place] or can not find your results in [facility] in 2 weeks please contact our office.  We recommend the following healthy lifestyle measures: - eat a healthy diet consisting of lots of vegetables, fruits, beans, nuts, seeds, healthy meats such as white chicken and fish and whole grains.  - avoid fried foods, fast food, processed foods, sodas, red meet and other fattening foods.  - get a least 150 minutes of aerobic exercise per week.   -reschedule appointment with counselor  -increase celexa to 40mg  daily - use condoms with sex to prevent pregnancy  Follow up in: 1-2 months     Patient advised to return to clinic immediately if symptoms worsen or persist or new concerns.  @LIFEPLAN @  No Follow-up on file.  Sherry Basque R.

## 2012-09-16 NOTE — Patient Instructions (Addendum)
-  We have ordered labs or studies at this visit. It can take up to 1-2 weeks for results and processing. We will contact you with instructions IF your results are abnormal. Normal results will be released to your [facility]. If you have not heard from [place] or can not find your results in [facility] in 2 weeks please contact our office.  We recommend the following healthy lifestyle measures: - eat a healthy diet consisting of lots of vegetables, fruits, beans, nuts, seeds, healthy meats such as white chicken and fish and whole grains.  - avoid fried foods, fast food, processed foods, sodas, red meet and other fattening foods.  - get a least 150 minutes of aerobic exercise per week.   -reschedule appointment with counselor  -increase celexa to 40mg  daily - use condoms with sex to prevent pregnancy  Follow up in: 1-2 months

## 2012-09-17 NOTE — Progress Notes (Signed)
Quick Note:  Left a detailed message for pt at designated cell phone number. ______ 

## 2012-09-24 NOTE — Progress Notes (Signed)
Quick Note:  Attempted to call pt; not available per operator. ______

## 2012-09-27 NOTE — Progress Notes (Signed)
Quick Note:  Spoke with pt and pt is aware. ______ 

## 2012-09-27 NOTE — Addendum Note (Signed)
Addended by: Azucena Freed on: 09/27/2012 03:51 PM   Modules accepted: Orders

## 2012-09-27 NOTE — Progress Notes (Signed)
Please continue to try to contact. If you can not reach her please send letter that pap was abnormal and that she needs to see a gynecologist. Please let her know referral placed and place referral to gyn for abnormal pap. Thanks.

## 2012-09-27 NOTE — Progress Notes (Signed)
Quick Note:  Left a message for pt to return call. Letter mailed to pt's home address. ______

## 2012-09-27 NOTE — Progress Notes (Signed)
Spoke with pt and pt is aware of results. Please place referral

## 2012-10-14 ENCOUNTER — Ambulatory Visit: Payer: 59 | Admitting: Family Medicine

## 2012-10-19 ENCOUNTER — Ambulatory Visit (INDEPENDENT_AMBULATORY_CARE_PROVIDER_SITE_OTHER): Payer: 59 | Admitting: Gynecology

## 2012-10-19 ENCOUNTER — Ambulatory Visit (INDEPENDENT_AMBULATORY_CARE_PROVIDER_SITE_OTHER): Payer: 59 | Admitting: Family Medicine

## 2012-10-19 ENCOUNTER — Encounter: Payer: Self-pay | Admitting: Family Medicine

## 2012-10-19 ENCOUNTER — Ambulatory Visit: Payer: 59 | Admitting: Family Medicine

## 2012-10-19 ENCOUNTER — Encounter: Payer: Self-pay | Admitting: Gynecology

## 2012-10-19 VITALS — BP 112/76 | HR 76 | Resp 12 | Ht 66.5 in | Wt 172.0 lb

## 2012-10-19 VITALS — BP 122/82 | Temp 98.6°F | Wt 173.0 lb

## 2012-10-19 DIAGNOSIS — B372 Candidiasis of skin and nail: Secondary | ICD-10-CM | POA: Insufficient documentation

## 2012-10-19 DIAGNOSIS — F341 Dysthymic disorder: Secondary | ICD-10-CM

## 2012-10-19 DIAGNOSIS — Z0000 Encounter for general adult medical examination without abnormal findings: Secondary | ICD-10-CM

## 2012-10-19 DIAGNOSIS — F418 Other specified anxiety disorders: Secondary | ICD-10-CM

## 2012-10-19 DIAGNOSIS — IMO0002 Reserved for concepts with insufficient information to code with codable children: Secondary | ICD-10-CM | POA: Insufficient documentation

## 2012-10-19 DIAGNOSIS — R6889 Other general symptoms and signs: Secondary | ICD-10-CM

## 2012-10-19 DIAGNOSIS — Z01419 Encounter for gynecological examination (general) (routine) without abnormal findings: Secondary | ICD-10-CM

## 2012-10-19 LAB — POCT URINALYSIS DIPSTICK
Blood, UA: 2
pH, UA: 6

## 2012-10-19 MED ORDER — CITALOPRAM HYDROBROMIDE 40 MG PO TABS: 40.0000 mg | ORAL_TABLET | Freq: Every day | ORAL | Status: DC

## 2012-10-19 MED ORDER — NYSTATIN-TRIAMCINOLONE 100000-0.1 UNIT/GM-% EX OINT: TOPICAL_OINTMENT | Freq: Two times a day (BID) | CUTANEOUS | Status: DC

## 2012-10-19 NOTE — Progress Notes (Signed)
32 y.o. Married [place] American female   [phone] here for annual exam. Pt is currently sexually active.  Pt has been referred from PCP for evaluation of ASCUS,+HRHPV,  Pt reports current partner for 3.5y, using ortho-evra patch for contraception.  Pt reports having colposcopy 7-8y ago, everything normal, PAP's normal since.  Last PAP 10/2008, normal. Pt denies post-coital bleeding, dyspareunia, dysmenorrhea.   Patient's last menstrual period was 10/15/2012.          Sexually active: yes  The current method of family planning is Ortho-Evra patches weekly.    Exercising: yes  walking on her job Last pap: 6/14- ASCUS + HPV Alcohol: no  Tobacco: no BSE: every now and then   Urine: Blood 2   Health Maintenance  Topic Date Due  . Influenza Vaccine  12/13/2012  . Pap Smear  09/17/2015  . Tetanus/tdap  11/16/2017    Family History  Problem Relation Age of Onset  . Diabetes Mother     Type 2  . Hypertension Mother   . Depression Father   . Diabetes Maternal Aunt   . Hypertension Maternal Aunt   . Hypertension Maternal Aunt     There are no active problems to display for this patient.   Past Medical History  Diagnosis Date  . Depression   . Anxiety     Past Surgical History  Procedure Laterality Date  . Cesarean section      x2  . Dental surgery  2010    6 teeth surgically removed    Allergies: Review of patient's allergies indicates no known allergies.  Current Outpatient Prescriptions  Medication Sig Dispense Refill  . citalopram (CELEXA) 40 MG tablet Take 1 tablet (40 mg total) by mouth daily.  90 tablet  3  . Multiple Vitamins-Minerals (ALIVE WOMENS ENERGY PO) Take 1 tablet by mouth daily.      . norelgestromin-ethinyl estradiol (ORTHO EVRA) 150-20 MCG/24HR transdermal patch Place 1 patch onto the skin once a week.  3 patch  12   No current facility-administered medications for this visit.    ROS: Pertinent items are noted in HPI.  Exam:    BP 112/76  Pulse  76  Resp 12  Ht 5' 6.5" (1.689 m)  Wt 172 lb (78.019 kg)  BMI 27.35 kg/m2  LMP 10/15/2012 Weight change: @WEIGHTCHANGE @ Last 3 height recordings:  Ht Readings from Last 3 Encounters:  10/19/12 5' 6.5" (1.689 m)  08/19/12 5\' 6"  (1.676 m)   General appearance: alert, cooperative and appears stated age Head: Normocephalic, without obvious abnormality, atraumatic Neck: no adenopathy, no carotid bruit, no JVD, supple, symmetrical, trachea midline and thyroid not enlarged, symmetric, no tenderness/mass/nodules Lungs: clear to auscultation bilaterally Breasts: normal appearance, no masses or tenderness, skin thickening and darkening under left breast greater than right, subtle fissure under left breast Heart: regular rate and rhythm, S1, S2 normal, no murmur, click, rub or gallop Abdomen: soft, non-tender; bowel sounds normal; no masses,  no organomegaly Extremities: extremities normal, atraumatic, no cyanosis or edema Skin: Skin color, texture, turgor normal. No rashes or lesions Lymph nodes: Cervical, supraclavicular, and axillary nodes normal. no inguinal nodes palpated Neurologic: Grossly normal   Pelvic: External genitalia:  no lesions              Urethra: normal appearing urethra with no masses, tenderness or lesions              Bartholins and Skenes: normal  Vagina: normal appearing vagina with normal color and discharge, no lesions              Cervix: normal appearance              Pap taken: no        Bimanual Exam:  Uterus:  uterus is normal size, shape, consistency and nontender                                      Adnexa:    normal adnexa in size, nontender and no masses                                      Rectovaginal: Deferred                                      Anus:  defer exam  A: ASCUS, +HRHPV Dermal yeast under breasts     P: discussed the implications of an ASCUS papsmear, the importance of further evaluation with a colposcopy to evaluate the  undetermined significance.  We reviewed the procedure with her and questions were addressed. she will return for colposcopy 2. Instructed to keep area dry as possible, powder, topical antifungal to area, wash bras in hottest water, mycolog ointment, will re-evaluate at colpo   An After Visit Summary was printed and given to the patient.

## 2012-10-19 NOTE — Progress Notes (Signed)
Chief Complaint  Patient presents with  . 1 month follow up    HPI:  Follow up:  Depression: -increased celexa last visit -reports: doing much better, mood is great, no SI, work is going well -using condoms -very active at work, no regular exercise otherwise  ROS: See pertinent positives and negatives per HPI.  Past Medical History  Diagnosis Date  . Depression     Family History  Problem Relation Age of Onset  . Diabetes Mother   . Hypertension Mother   . Depression Father   . Diabetes Maternal Aunt     History   Social History  . Marital Status: Married    Spouse Name: N/A    Number of Children: N/A  . Years of Education: N/A   Social History Main Topics  . Smoking status: Never Smoker   . Smokeless tobacco: None  . Alcohol Use: Yes     Comment: occ - 1 - 2 drinks every other weekend  . Drug Use: No  . Sexually Active: Yes     Comment: with husband   Other Topics Concern  . 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: living with husband and son 41 and daughter 2 (in 2014)      Spiritual Beliefs: christian      Lifestyle: no regular exercise, diet is not good             Current outpatient prescriptions:citalopram (CELEXA) 40 MG tablet, Take 1 tablet (40 mg total) by mouth daily., Disp: 90 tablet, Rfl: 3;  Multiple Vitamins-Minerals (ALIVE WOMENS ENERGY PO), Take 1 tablet by mouth daily., Disp: , Rfl: ;  norelgestromin-ethinyl estradiol (ORTHO EVRA) 150-20 MCG/24HR transdermal patch, Place 1 patch onto the skin once a week., Disp: 3 patch, Rfl: 12  EXAM:  Filed Vitals:   10/19/12 1019  BP: 122/82  Temp: 98.6 F (37 C)    Body mass index is 27.94 kg/(m^2).  GENERAL: vitals reviewed and listed above, alert, oriented, appears well hydrated and in no acute distress  HEENT: atraumatic, conjunttiva clear, no obvious abnormalities on inspection of external nose and ears  NECK: no  obvious masses on inspection  LUNGS: clear to auscultation bilaterally, no wheezes, rales or rhonchi, good air movement  CV: HRRR, no peripheral edema  MS: moves all extremities without noticeable abnormality  PSYCH: pleasant and cooperative, no obvious depression or anxiety  ASSESSMENT AND PLAN:  Discussed the following assessment and plan:  Depression with anxiety - Plan: citalopram (CELEXA) 40 MG tablet  -doing well, continue current dose - follow up 4-6 months -advised again of medication interactions -Patient advised to return or notify a doctor immediately if symptoms worsen or persist or new concerns arise.  There are no Patient Instructions on file for this visit.   Kriste Basque R.

## 2012-10-19 NOTE — Patient Instructions (Signed)
Return for colposcopy Keep area under breasts dry, wash bras in hottest water, microwave. Apply mycolog ointment twice a day, open to air if possible powder

## 2013-01-25 ENCOUNTER — Encounter: Payer: Self-pay | Admitting: Gynecology

## 2013-01-28 ENCOUNTER — Other Ambulatory Visit: Payer: Self-pay | Admitting: Family Medicine

## 2013-03-03 ENCOUNTER — Ambulatory Visit: Payer: 59 | Admitting: Family Medicine

## 2013-03-17 ENCOUNTER — Encounter: Payer: Self-pay | Admitting: Family Medicine

## 2013-03-17 ENCOUNTER — Ambulatory Visit (INDEPENDENT_AMBULATORY_CARE_PROVIDER_SITE_OTHER): Payer: 59 | Admitting: Family Medicine

## 2013-03-17 VITALS — BP 130/90 | Temp 97.9°F | Wt 177.0 lb

## 2013-03-17 DIAGNOSIS — F32A Depression, unspecified: Secondary | ICD-10-CM

## 2013-03-17 DIAGNOSIS — F329 Major depressive disorder, single episode, unspecified: Secondary | ICD-10-CM

## 2013-03-17 NOTE — Progress Notes (Signed)
Chief Complaint  Patient presents with  . Follow-up    HPI:  Follow up:  Depression and anxiety: -on celexa -reports: reports mood has been good despite holiday stress and finishing medical administration degree -denies:crying, thoughts of self harm   ROS: See pertinent positives and negatives per HPI.  Past Medical History  Diagnosis Date  . Depression   . Anxiety     Past Surgical History  Procedure Laterality Date  . Cesarean section      x2  . Dental surgery  2010    6 teeth surgically removed    Family History  Problem Relation Age of Onset  . Diabetes Mother     Type 2  . Hypertension Mother   . Depression Father   . Diabetes Maternal Aunt   . Hypertension Maternal Aunt   . Hypertension Maternal Aunt     History   Social History  . Marital Status: Married    Spouse Name: N/A    Number of Children: N/A  . Years of Education: N/A   Social History Main Topics  . Smoking status: Never Smoker   . Smokeless tobacco: None  . Alcohol Use: Yes     Comment: occ - 1 - 2 drinks every other weekend  . Drug Use: No  . Sexual Activity: Yes     Comment: with husband   Other Topics Concern  . 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: living with husband and son 63 and daughter 2 (in 2014)      Spiritual Beliefs: christian      Lifestyle: no regular exercise, diet is not good             Current outpatient prescriptions:citalopram (CELEXA) 40 MG tablet, Take 1 tablet (40 mg total) by mouth daily., Disp: 90 tablet, Rfl: 3;  Multiple Vitamins-Minerals (ALIVE WOMENS ENERGY PO), Take 1 tablet by mouth daily., Disp: , Rfl: ;  norelgestromin-ethinyl estradiol (ORTHO EVRA) 150-20 MCG/24HR transdermal patch, Place 1 patch onto the skin once a week., Disp: 3 patch, Rfl: 12 nystatin-triamcinolone ointment (MYCOLOG), Apply topically 2 (two) times daily. Apply BID for up to 14 days., Disp: 60 g, Rfl:  0  EXAM:  Filed Vitals:   03/17/13 1332  BP: 130/90  Temp: 97.9 F (36.6 C)    Body mass index is 28.14 kg/(m^2).  GENERAL: vitals reviewed and listed above, alert, oriented, appears well hydrated and in no acute distress  HEENT: atraumatic, conjunttiva clear, no obvious abnormalities on inspection of external nose and ears  NECK: no obvious masses on inspection  LUNGS: clear to auscultation bilaterally, no wheezes, rales or rhonchi, good air movement  CV: HRRR, no peripheral edema  MS: moves all extremities without noticeable abnormality  PSYCH: pleasant and cooperative, no obvious depression or anxiety  ASSESSMENT AND PLAN:  Discussed the following assessment and plan:  Depression  -doing great -continue medication until finishes degree program, follow up in 6 months and consider taper off medication then -Patient advised to return or notify a doctor immediately if symptoms worsen or persist or new concerns arise.  There are no Patient Instructions on file for this visit.   Kriste Basque R.

## 2013-06-02 ENCOUNTER — Ambulatory Visit (INDEPENDENT_AMBULATORY_CARE_PROVIDER_SITE_OTHER): Payer: 59 | Admitting: Family Medicine

## 2013-06-02 ENCOUNTER — Encounter: Payer: Self-pay | Admitting: Family Medicine

## 2013-06-02 VITALS — BP 124/90 | Temp 98.0°F | Wt 183.0 lb

## 2013-06-02 DIAGNOSIS — R5383 Other fatigue: Secondary | ICD-10-CM

## 2013-06-02 DIAGNOSIS — M545 Low back pain, unspecified: Secondary | ICD-10-CM

## 2013-06-02 DIAGNOSIS — F329 Major depressive disorder, single episode, unspecified: Secondary | ICD-10-CM

## 2013-06-02 DIAGNOSIS — F419 Anxiety disorder, unspecified: Principal | ICD-10-CM

## 2013-06-02 DIAGNOSIS — F32A Depression, unspecified: Secondary | ICD-10-CM

## 2013-06-02 DIAGNOSIS — F341 Dysthymic disorder: Secondary | ICD-10-CM

## 2013-06-02 DIAGNOSIS — R5381 Other malaise: Secondary | ICD-10-CM

## 2013-06-02 LAB — BASIC METABOLIC PANEL
BUN: 12 mg/dL (ref 6–23)
CALCIUM: 9.2 mg/dL (ref 8.4–10.5)
CHLORIDE: 105 meq/L (ref 96–112)
CO2: 26 meq/L (ref 19–32)
CREATININE: 0.8 mg/dL (ref 0.4–1.2)
GFR: 107.63 mL/min (ref >60.00)
Glucose, Bld: 55 mg/dL — ABNORMAL LOW (ref 70–99)
Potassium: 3.7 mEq/L (ref 3.5–5.1)
SODIUM: 137 meq/L (ref 135–145)

## 2013-06-02 LAB — CBC WITH DIFFERENTIAL/PLATELET
BASOS ABS: 0 10*3/uL (ref 0.0–0.1)
Basophils Relative: 0.4 % (ref 0.0–3.0)
EOS ABS: 0.2 10*3/uL (ref 0.0–0.7)
Eosinophils Relative: 2.4 % (ref 0.0–5.0)
HCT: 42.1 % (ref 36.0–46.0)
Hemoglobin: 13.5 g/dL (ref 12.0–15.0)
LYMPHS PCT: 38.8 % (ref 12.0–46.0)
Lymphs Abs: 2.5 10*3/uL (ref 0.7–4.0)
MCHC: 32 g/dL (ref 30.0–36.0)
MCV: 91.2 fl (ref 78.0–100.0)
MONOS PCT: 9.1 % (ref 3.0–12.0)
Monocytes Absolute: 0.6 10*3/uL (ref 0.1–1.0)
NEUTROS ABS: 3.2 10*3/uL (ref 1.4–7.7)
Neutrophils Relative %: 49.3 % (ref 43.0–77.0)
PLATELETS: 244 10*3/uL (ref 150.0–400.0)
RBC: 4.62 Mil/uL (ref 3.87–5.11)
RDW: 13.8 % (ref 11.5–14.6)
WBC: 6.4 10*3/uL (ref 4.5–10.5)

## 2013-06-02 LAB — TSH: TSH: 0.8 u[IU]/mL (ref 0.35–5.50)

## 2013-06-02 LAB — T4, FREE: FREE T4: 0.72 ng/dL (ref 0.60–1.60)

## 2013-06-02 NOTE — Patient Instructions (Signed)
-  get counseling  -home exercises at least 4 times per week  -take your celexa every day  -We have ordered labs or studies at this visit. It can take up to 1-2 weeks for results and processing. We will contact you with instructions IF your results are abnormal. Normal results will be released to your [facility]. If you have not heard from [place] or can not find your results in [facility] in 2 weeks please contact our office.  -follow up in 1 month or sooner if needed

## 2013-06-02 NOTE — Progress Notes (Signed)
Pre visit review using our clinic review tool, if applicable. No additional management support is needed unless otherwise documented below in the visit note. 

## 2013-06-02 NOTE — Progress Notes (Signed)
Chief Complaint  Patient presents with  . Fatigue    body aches, chest pain; pt stopped celexa for a few days then restarted.     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  Past Medical History  Diagnosis Date  . Depression   . Anxiety     Past Surgical History  Procedure Laterality Date  . Cesarean section      x2  . Dental surgery  2010    6 teeth surgically removed    Family History  Problem Relation Age of Onset  . Diabetes Mother     Type 2  . Hypertension Mother   . Depression Father   . Diabetes Maternal Aunt   . Hypertension Maternal Aunt   . Hypertension Maternal Aunt     History   Social History  . Marital Status: Married    Spouse Name: N/A    Number of Children: N/A  . Years of Education: N/A   Social History Main Topics  . Smoking status: Never Smoker   . Smokeless tobacco: None  . Alcohol Use: Yes     Comment: occ - 1 - 2 drinks every other weekend  . Drug Use: No  . Sexual Activity: Yes     Comment: with husband   Other Topics Concern  . 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: living with husband and son 64 and daughter 2 (in 2014)      Spiritual Beliefs: christian      Lifestyle: no regular exercise, diet is not good             Current outpatient prescriptions:citalopram (CELEXA) 40  MG tablet, Take 1 tablet (40 mg total) by mouth daily., Disp: 90 tablet, Rfl: 3;  norelgestromin-ethinyl estradiol (ORTHO EVRA) 150-20 MCG/24HR transdermal patch, Place 1 patch onto the skin once a week., Disp: 3 patch, Rfl: 12;  Multiple Vitamins-Minerals (ALIVE WOMENS ENERGY PO), Take 1 tablet by mouth daily., Disp: , Rfl:  nystatin-triamcinolone ointment (MYCOLOG), Apply topically 2 (two) times daily. Apply BID for up to 14 days., Disp: 60 g, Rfl: 0  EXAM:  Filed Vitals:   06/02/13 1305  BP: 124/90  Temp: 98 F (36.7 C)    Body mass index is 29.1 kg/(m^2).  GENERAL: vitals reviewed and listed above, alert, oriented, appears well hydrated and in no acute distress  HEENT: atraumatic, conjunttiva clear, no obvious abnormalities on inspection of external nose and ears  NECK: no obvious masses on inspection  LUNGS: clear to auscultation bilaterally, no wheezes, rales or rhonchi, good air movement  CV: HRRR, no peripheral edema  MS: moves all extremities without noticeable abnormality - reproducible CP costochondal jction upp bilat Normal Gait Normal inspection of back, no obvious scoliosis or leg length descrepancy No bony TTP Soft  tissue TTP at: L quad lumb muscle -/+ tests: neg trendelenburg,-facet loading, -SLRT, -CLRT, -FABER, -FADIR Normal muscle strength, sensation to light touch and DTRs in LEs bilaterally  PSYCH: pleasant and cooperative, no obvious depression or anxiety  ASSESSMENT AND PLAN:  Discussed the following assessment and plan:  Anxiety and depression  Other malaise and fatigue - Plan: TSH, T4, Free, CBC with Differential, Basic metabolic panel  Low back pain  -we discussed possible serious and likely etiologies, workup and treatment, treatment risks and return precautions -her general poor mood and fatigue are likely a result of poor control of her depression as resolved on antidepressant then started again when she stopped taking her medication - advised  to restart celexa on a consistent basis and use pill box and counseling (scheduled per pt) -discussed etiologies of brief episode of chest wall pain at rest and likely precordial catch or chest wall pain as very brief, at rest and relieved with deep breath -basic labs for fatigue -lumbar strain likely an tx with HEP, conservative tx -after this discussion, Verenis opted for above -follow up advised in 1 month -of course, we advised Liticia  to return or notify a doctor immediately if symptoms worsen or persist or new concerns arise.   -Patient advised to return or notify a doctor immediately if symptoms worsen or persist or new concerns arise.  Patient Instructions  -get counseling  -home exercises at least 4 times per week  -take your celexa every day  -We have ordered labs or studies at this visit. It can take up to 1-2 weeks for results and processing. We will contact you with instructions IF your results are abnormal. Normal results will be released to your [facility]. If you have not heard from [place] or can not find your results in [facility] in 2 weeks please contact our office.  -follow up in 1 month or sooner if needed     Zoriana Oats R.

## 2013-06-30 ENCOUNTER — Encounter: Payer: Self-pay | Admitting: Family Medicine

## 2013-06-30 ENCOUNTER — Ambulatory Visit (INDEPENDENT_AMBULATORY_CARE_PROVIDER_SITE_OTHER): Payer: 59 | Admitting: Family Medicine

## 2013-06-30 VITALS — BP 118/90 | Temp 98.2°F | Wt 182.0 lb

## 2013-06-30 DIAGNOSIS — S335XXA Sprain of ligaments of lumbar spine, initial encounter: Secondary | ICD-10-CM

## 2013-06-30 DIAGNOSIS — F32A Depression, unspecified: Secondary | ICD-10-CM

## 2013-06-30 DIAGNOSIS — F329 Major depressive disorder, single episode, unspecified: Secondary | ICD-10-CM

## 2013-06-30 DIAGNOSIS — S39012A Strain of muscle, fascia and tendon of lower back, initial encounter: Secondary | ICD-10-CM

## 2013-06-30 DIAGNOSIS — R0789 Other chest pain: Secondary | ICD-10-CM

## 2013-06-30 DIAGNOSIS — F3289 Other specified depressive episodes: Secondary | ICD-10-CM

## 2013-06-30 NOTE — Progress Notes (Signed)
Pre visit review using our clinic review tool, if applicable. No additional management support is needed unless otherwise documented below in the visit note. 

## 2013-06-30 NOTE — Patient Instructions (Signed)
-  continue the celexa daily and the counseling  -follow up in 3-4 months

## 2013-06-30 NOTE — Progress Notes (Signed)
Chief Complaint  Patient presents with  . 1 month follow up    HPI:   Follow up:  Depression: -restarted celexa 40 mgand advised counseling last visit -she reports:she reports had one therapy session and this was very helpful, has another visit coming up, feels like celexa is working well and she is doing much better -denies: side effects, thoughts of self harm -cbc, tft, bmp normal  Atypical CP: -she reports: doing much better -denies: SOB, palpitation, swelling  Lumbar strain: -tx with HEP, heat, supportive care last visit -she reports: has not done the exercises as much as she should but back feels much better  ROS: See pertinent positives and negatives per HPI.  Past Medical History  Diagnosis Date  . Depression   . Anxiety     Past Surgical History  Procedure Laterality Date  . Cesarean section      x2  . Dental surgery  2010    6 teeth surgically removed    Family History  Problem Relation Age of Onset  . Diabetes Mother     Type 2  . Hypertension Mother   . Depression Father   . Diabetes Maternal Aunt   . Hypertension Maternal Aunt   . Hypertension Maternal Aunt     History   Social History  . Marital Status: Married    Spouse Name: N/A    Number of Children: N/A  . Years of Education: N/A   Social History Main Topics  . Smoking status: Never Smoker   . Smokeless tobacco: None  . Alcohol Use: Yes     Comment: occ - 1 - 2 drinks every other weekend  . Drug Use: No  . Sexual Activity: Yes     Comment: with husband   Other Topics Concern  . 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[employer]: living with husband and son 106 and daughter 2 (in 2014)      Spiritual Beliefs: christian      Lifestyle: no regular exercise, diet is not good             Current outpatient prescriptions:citalopram (CELEXA) 40 MG tablet, Take 1 tablet (40 mg total) by mouth daily., Disp: 90 tablet,  Rfl: 3;  Multiple Vitamins-Minerals (ALIVE WOMENS ENERGY PO), Take 1 tablet by mouth daily., Disp: , Rfl: ;  norelgestromin-ethinyl estradiol (ORTHO EVRA) 150-20 MCG/24HR transdermal patch, Place 1 patch onto the skin once a week., Disp: 3 patch, Rfl: 12 nystatin-triamcinolone ointment (MYCOLOG), Apply topically 2 (two) times daily. Apply BID for up to 14 days., Disp: 60 g, Rfl: 0  EXAM:  Filed Vitals:   06/30/13 1329  BP: 118/90  Temp: 98.2 F (36.8 C)    Body mass index is 28.94 kg/(m^2).  GENERAL: vitals reviewed and listed above, alert, oriented, appears well hydrated and in no acute distress  HEENT: atraumatic, conjunttiva clear, no obvious abnormalities on inspection of external nose and ears  NECK: no obvious masses on inspection  LUNGS: clear to auscultation bilaterally, no wheezes, rales or rhonchi, good air movement  CV: HRRR, no peripheral edema  MS: moves all extremities without noticeable abnormality  PSYCH: pleasant and cooperative, no obvious depression or anxiety  ASSESSMENT AND PLAN:  Discussed the following assessment and plan:  Depression  Atypical chest pain  Lumbar strain  -she is doing much better -continue celexa -continue counseling -follow up 3-4 months -Patient advised  to return or notify a doctor immediately if symptoms worsen or persist or new concerns arise.  Patient Instructions  -continue the celexa daily and the counseling  -follow up in 3-4 months     KIM, HANNAH R.

## 2013-08-18 ENCOUNTER — Ambulatory Visit (INDEPENDENT_AMBULATORY_CARE_PROVIDER_SITE_OTHER): Payer: 59 | Admitting: Certified Nurse Midwife

## 2013-08-18 ENCOUNTER — Encounter: Payer: Self-pay | Admitting: Certified Nurse Midwife

## 2013-08-18 VITALS — BP 110/64 | HR 68 | Resp 16 | Ht 66.5 in | Wt 181.0 lb

## 2013-08-18 DIAGNOSIS — Z3201 Encounter for pregnancy test, result positive: Secondary | ICD-10-CM

## 2013-08-18 DIAGNOSIS — N912 Amenorrhea, unspecified: Secondary | ICD-10-CM

## 2013-08-18 LAB — POCT URINE PREGNANCY: PREG TEST UR: POSITIVE

## 2013-08-18 NOTE — Patient Instructions (Signed)

## 2013-08-18 NOTE — Progress Notes (Signed)
Reviewed personally.  M. Suzanne Whyatt Klinger, MD.  

## 2013-08-18 NOTE — Progress Notes (Signed)
33 y.o. Married Caucasian female [name] here with amenorrhea since 07/10/13. Patient denies any bleeding or spotting since period. Unplanned pregnancy, but using Ortho Evra, had some problems with adhesion. Patient having some nausea and vomiting. Patient is able to maintain good food intake and fluids. Patient was on Celexa for anxiety/depression and stopped once she had positive UPT. Patient had c-section with previous 2 pregnancies and plans again. Denies any other medication use. Spouse with patient, happy about news. 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  O: Healthy WD,WN female Affect: Normal, orientation x 3  A:Amenorrhea with positive UPT with EDC 04/16/14 at approximately 5 wk 5 d per LMP. Unplanned pregnancy with Ortho Evra use Previous 2 c-section History of ASCUS + HPVHR, pt came for consult but did not return for colposcopy  P: Discussed importance of prenatal care and given provider list to decide and call to schedule appointment. Offered PUS for viability here, will call if she decides to have prior to first visit. Discussed nutritional needs, things to avoid in diet, normal activity with pregnancy, warning signs of early pregnancy and medication use. Advised to start on OTC children chewable multivitamin daily.( All she has been able to tolerate in past).Questions addressed at length. Patient will call with when OB appointment is scheduled, so records can be sent, especially with history of abnormal pap smear and not follow up done. Patient agreeable. Wished well with pregnancy.   RV prn  35 minutes spent with patient with >50% of time spent in face to face counseling.

## 2013-08-22 ENCOUNTER — Telehealth: Payer: Self-pay | Admitting: Gynecology

## 2013-08-22 NOTE — Telephone Encounter (Signed)
Lm to call about recent visit

## 2013-09-06 ENCOUNTER — Other Ambulatory Visit: Payer: Self-pay | Admitting: Obstetrics and Gynecology

## 2013-09-15 ENCOUNTER — Encounter: Payer: 59 | Admitting: Family Medicine

## 2013-09-15 DIAGNOSIS — Z0289 Encounter for other administrative examinations: Secondary | ICD-10-CM

## 2013-09-15 NOTE — Progress Notes (Signed)
Error   This encounter was created in error - please disregard. 

## 2013-09-27 LAB — OB RESULTS CONSOLE HEPATITIS B SURFACE ANTIGEN: HEP B S AG: NEGATIVE

## 2013-09-27 LAB — OB RESULTS CONSOLE RPR: RPR: NONREACTIVE

## 2013-09-27 LAB — OB RESULTS CONSOLE ANTIBODY SCREEN: ANTIBODY SCREEN: NEGATIVE

## 2013-09-27 LAB — OB RESULTS CONSOLE ABO/RH: RH TYPE: POSITIVE

## 2013-09-27 LAB — OB RESULTS CONSOLE RUBELLA ANTIBODY, IGM: Rubella: IMMUNE

## 2013-09-27 LAB — OB RESULTS CONSOLE HIV ANTIBODY (ROUTINE TESTING): HIV: NONREACTIVE

## 2013-10-20 ENCOUNTER — Ambulatory Visit: Payer: 59 | Admitting: Gynecology

## 2013-10-21 ENCOUNTER — Ambulatory Visit: Payer: 59 | Admitting: Gynecology

## 2013-10-31 ENCOUNTER — Encounter: Payer: 59 | Admitting: Family Medicine

## 2013-10-31 DIAGNOSIS — Z0289 Encounter for other administrative examinations: Secondary | ICD-10-CM

## 2013-10-31 NOTE — Progress Notes (Signed)
Error   This encounter was created in error - please disregard. 

## 2013-12-02 ENCOUNTER — Emergency Department (HOSPITAL_COMMUNITY)
Admission: EM | Admit: 2013-12-02 | Discharge: 2013-12-02 | Disposition: A | Discharge status: Home / Self Care | Payer: Medicaid Other | Attending: Emergency Medicine | Admitting: Emergency Medicine

## 2013-12-02 ENCOUNTER — Encounter (HOSPITAL_COMMUNITY): Payer: Self-pay | Admitting: *Deleted

## 2013-12-02 DIAGNOSIS — O9989 Other specified diseases and conditions complicating pregnancy, childbirth and the puerperium: Secondary | ICD-10-CM | POA: Insufficient documentation

## 2013-12-02 DIAGNOSIS — Y9289 Other specified places as the place of occurrence of the external cause: Secondary | ICD-10-CM | POA: Insufficient documentation

## 2013-12-02 DIAGNOSIS — Z3490 Encounter for supervision of normal pregnancy, unspecified, unspecified trimester: Secondary | ICD-10-CM

## 2013-12-02 DIAGNOSIS — W1809XA Striking against other object with subsequent fall, initial encounter: Secondary | ICD-10-CM | POA: Insufficient documentation

## 2013-12-02 DIAGNOSIS — Z8659 Personal history of other mental and behavioral disorders: Secondary | ICD-10-CM | POA: Insufficient documentation

## 2013-12-02 DIAGNOSIS — Y9389 Activity, other specified: Secondary | ICD-10-CM | POA: Diagnosis not present

## 2013-12-02 DIAGNOSIS — R55 Syncope and collapse: Secondary | ICD-10-CM

## 2013-12-02 LAB — BASIC METABOLIC PANEL
ANION GAP: 11 (ref 5–15)
BUN: 4 mg/dL — AB (ref 6–23)
CHLORIDE: 102 meq/L (ref 96–112)
CO2: 23 meq/L (ref 19–32)
Calcium: 9.1 mg/dL (ref 8.4–10.5)
Creatinine, Ser: 0.7 mg/dL (ref 0.50–1.10)
GFR calc Af Amer: >90 mL/min (ref >90)
GFR calc non Af Amer: >90 mL/min (ref >90)
Glucose, Bld: 84 mg/dL (ref 70–99)
Potassium: 4.2 mEq/L (ref 3.7–5.3)
Sodium: 136 mEq/L — ABNORMAL LOW (ref 137–147)

## 2013-12-02 LAB — CBC WITH DIFFERENTIAL/PLATELET
Basophils Absolute: 0 10*3/uL (ref 0.0–0.1)
Basophils Relative: 0 % (ref 0–1)
Eosinophils Absolute: 0.1 10*3/uL (ref 0.0–0.7)
Eosinophils Relative: 1 % (ref 0–5)
HEMATOCRIT: 33.3 % — AB (ref 36.0–46.0)
HEMOGLOBIN: 11.3 g/dL — AB (ref 12.0–15.0)
LYMPHS ABS: 2.4 10*3/uL (ref 0.7–4.0)
Lymphocytes Relative: 17 % (ref 12–46)
MCH: 30.1 pg (ref 26.0–34.0)
MCHC: 33.9 g/dL (ref 30.0–36.0)
MCV: 88.8 fL (ref 78.0–100.0)
MONOS PCT: 4 % (ref 3–12)
Monocytes Absolute: 0.6 10*3/uL (ref 0.1–1.0)
NEUTROS ABS: 11.1 10*3/uL — AB (ref 1.7–7.7)
Neutrophils Relative %: 78 % — ABNORMAL HIGH (ref 43–77)
Platelets: 246 10*3/uL (ref 150–400)
RBC: 3.75 MIL/uL — ABNORMAL LOW (ref 3.87–5.11)
RDW: 13.5 % (ref 11.5–15.5)
WBC: 14.3 10*3/uL — AB (ref 4.0–10.5)

## 2013-12-02 MED ORDER — SODIUM CHLORIDE 0.9 % IV BOLUS (SEPSIS)
1000.0000 mL | Freq: Once | INTRAVENOUS | Status: AC
  Administered 2013-12-02: 1000 mL via INTRAVENOUS

## 2013-12-02 NOTE — ED Provider Notes (Signed)
CSN: [id number]     Arrival date & time 12/02/13  1433 History   First MD Initiated Contact with Patient 12/02/13 1434     Chief Complaint  Patient presents with  . Near Syncope     (Consider location/radiation/quality/duration/timing/severity/associated sxs/prior Treatment) HPI Comments: Patient who is currently [redacted] weeks pregnant presents today after a syncopal event.  She reports that just prior to arrival she was in the check out line at [facility] and began to feel lightheaded, diaphoretic, and warm.  Her vision then began to go black.  She reports that the next thing that she remembers is waking up on the ground.  She hit her head on a rack of potato chips when she fell.  Event was witnessed by her mother.  Mother reports that she did not notice any convulsions.  She denies any symptoms at this time.  No chest pain, SOB, fever, chills, vaginal bleeding, vaginal discharge, nausea, vomiting, abdominal pain, pelvic pain, leakage of fluid, headache, or urinary symptoms.  She denies any bowel or bladder incontinence associated with the episode.  She reports that her OB/GYN is [facility].  She states that she has had an ultrasound confirming an IUP.  No complications with the pregnancy thus far.  She reports normal fetal movements since the syncope event.    The history is provided by the patient.    Past Medical History  Diagnosis Date  . Depression   . Anxiety    Past Surgical History  Procedure Laterality Date  . Cesarean section      x2  . Dental surgery  2010    6 teeth surgically removed   Family History  Problem Relation Age of Onset  . Diabetes Mother     Type 2  . Hypertension Mother   . Depression Father   . Diabetes Maternal Aunt   . Hypertension Maternal Aunt   . Hypertension Maternal Aunt    History  Substance Use Topics  . Smoking status: Never Smoker   . Smokeless tobacco: Not on file  . Alcohol Use: No   OB History   Grav Para Term Preterm Abortions  TAB SAB Ect Mult Living   3 2 2       2      Review of Systems  Constitutional: Negative for fever and chills.  Respiratory: Negative for shortness of breath.   Cardiovascular: Negative for chest pain.  Gastrointestinal: Negative for nausea, vomiting and abdominal pain.  Genitourinary: Negative for dysuria, vaginal bleeding and vaginal discharge.  All other systems reviewed and are negative.     Allergies  Review of patient's allergies indicates no known allergies.  Home Medications   Prior to Admission medications   Not on File   BP 125/80  Pulse 84  Temp(Src) 98.1 F (36.7 C) (Oral)  Resp 16  Ht 5\' 5"  (1.651 m)  Wt 178 lb (80.74 kg)  BMI 29.62 kg/m2  SpO2 100%  LMP 07/10/2013 Physical Exam  Nursing note and vitals reviewed. Constitutional: She appears well-developed and well-nourished.  HENT:  Head: Normocephalic and atraumatic.  Mouth/Throat: Oropharynx is clear and moist.  Eyes: EOM are normal. Pupils are equal, round, and reactive to light.  Neck: Normal range of motion. Neck supple.  Cardiovascular: Normal rate, regular rhythm and normal heart sounds.   Pulmonary/Chest: Effort normal and breath sounds normal.  Abdominal: There is no tenderness.  Gravid abdomen  Musculoskeletal: Normal range of motion.  Neurological: She is alert. She has normal  strength. No cranial nerve deficit or sensory deficit. Coordination and gait normal.  Skin: Skin is warm and dry.  Psychiatric: She has a normal mood and affect.    ED Course  Procedures (including critical care time) Labs Review Labs Reviewed  CBC WITH DIFFERENTIAL  BASIC METABOLIC PANEL    Imaging Review No results found.   EKG Interpretation None      Date: 12/02/2013  Rate: 82  Rhythm: normal sinus rhythm  QRS Axis: normal  Intervals: normal  ST/T Wave abnormalities: normal  Conduction Disutrbances:none  Narrative Interpretation:   Old EKG Reviewed: none available    MDM   Final diagnoses:   None   Patient who is currently [redacted] weeks pregnant presents today after a syncopal episode.  No vaginal bleeding or leakage of fluid.  No abdominal or pelvic pain.  Labs today unremarkable.  Patient evaluated by OB Rapid Response Nurse in the ED who discussed with Dr. Ouida Sills.  Fetal Heart tones 160's.  Normal fetal movement.  OB/GYN determined that the patient was stable for discharge from an OB point of view.  VSS.  Pulse ox 100 on RA.  Normal neurological exam.  EKG unremarkable. No CP or SOB.  History and physical most consistent with Vasovagal syncope.  Feel that the patient is stable for discharge.  Return precautions given.    Hyman Bible, PA-C 12/02/13 1624

## 2013-12-02 NOTE — Progress Notes (Signed)
Pt is 21w 4d pregnantm G3 P2 (two previous c/sections) reporting getting faint in grocery store, everything going black and passing out. Black-out witnessed by pt's mother, no report of seizure activity. Pt states she ate at 10am and was walking around store all day. No c/o contractions or ROM or vag bleeding. Baby very active, FH via doppler 160.

## 2013-12-02 NOTE — ED Notes (Signed)
OB rapid response called.

## 2013-12-02 NOTE — Progress Notes (Signed)
Spoke to Dr Freda Munro on the phone. Pt cleared obstetrically. Pt to keep office appointment in two weeks, to call office with any questions or concerns. Reviewed signs of labor and ROM. Pt has no questions at this time.

## 2013-12-02 NOTE — ED Notes (Signed)
Per EMS: pt was standing in [employer] when she began to feel hot, pt states she starting to see black and fell hitting her head on the metal rack near her. Pt denies any abdominal trauma or pain. Pt denies any vaginal bleeding or cramping. nad noted upon arrival. Pt denies any pain currently. Axo x4

## 2013-12-04 NOTE — ED Provider Notes (Signed)
Medical screening examination/treatment/procedure(s) were performed by non-physician practitioner and as supervising physician I was immediately available for consultation/collaboration.   Dot Lanes, MD 12/04/13 1125

## 2014-01-16 ENCOUNTER — Inpatient Hospital Stay (HOSPITAL_COMMUNITY)
Admission: AD | Admit: 2014-01-16 | Discharge: 2014-01-16 | Disposition: A | Discharge status: Home / Self Care | Payer: Medicaid Other | Source: Ambulatory Visit | Attending: Obstetrics and Gynecology | Admitting: Obstetrics and Gynecology

## 2014-01-16 ENCOUNTER — Encounter (HOSPITAL_COMMUNITY): Payer: Self-pay | Admitting: *Deleted

## 2014-01-16 DIAGNOSIS — O4693 Antepartum hemorrhage, unspecified, third trimester: Secondary | ICD-10-CM

## 2014-01-16 DIAGNOSIS — N859 Noninflammatory disorder of uterus, unspecified: Secondary | ICD-10-CM

## 2014-01-16 DIAGNOSIS — Z3A28 28 weeks gestation of pregnancy: Secondary | ICD-10-CM | POA: Insufficient documentation

## 2014-01-16 DIAGNOSIS — N858 Other specified noninflammatory disorders of uterus: Secondary | ICD-10-CM

## 2014-01-16 NOTE — Discharge Instructions (Signed)
Pelvic Rest Pelvic rest is sometimes recommended for women when:   The placenta is partially or completely covering the opening of the cervix (placenta previa).  There is bleeding between the uterine wall and the amniotic sac in the first trimester (subchorionic hemorrhage).  The cervix begins to open without labor starting (incompetent cervix, cervical insufficiency).  The labor is too early (preterm labor). HOME CARE INSTRUCTIONS  Do not have sexual intercourse, stimulation, or an orgasm.  Do not use tampons, douche, or put anything in the vagina.  Do not lift anything over 10 pounds (4.5 kg).  Avoid strenuous activity or straining your pelvic muscles. SEEK MEDICAL CARE IF:  You have any vaginal bleeding during pregnancy. Treat this as a potential emergency.  You have cramping pain felt low in the stomach (stronger than menstrual cramps).  You notice vaginal discharge (watery, mucus, or bloody).  You have a low, dull backache.  There are regular contractions or uterine tightening. SEEK IMMEDIATE MEDICAL CARE IF: You have vaginal bleeding and have placenta previa.  Document Released: 07/26/2010 Document Revised: 06/23/2011 Document Reviewed: 07/26/2010 [facility] Patient Information 2015 [place], [place]. This information is not intended to replace advice given to you by your health care provider. Make sure you discuss any questions you have with your health care provider.  Vaginal Bleeding During Pregnancy, Third Trimester A small amount of bleeding (spotting) from the vagina is relatively common in pregnancy. Various things can cause bleeding or spotting in pregnancy. Sometimes the bleeding is normal and is not a problem. However, bleeding during the third trimester can also be a sign of something serious for the mother and the baby. Be sure to tell your health care provider about any vaginal bleeding right away.  Some possible causes of vaginal bleeding during the third  trimester include:   The placenta may be partially or completely covering the opening to the cervix (placenta previa).   The placenta may have separated from the uterus (abruption of the placenta).   There may be an infection or growth on the cervix.   You may be starting labor, called discharging of the mucus plug.   The placenta may grow into the muscle layer of the uterus (placenta accreta).  HOME CARE INSTRUCTIONS  Watch your condition for any changes. The following actions may help to lessen any discomfort you are feeling:   Follow your health care provider's instructions for limiting your activity. If your health care provider orders bed rest, you may need to stay in bed and only get up to use the bathroom. However, your health care provider may allow you to continue light activity.  If needed, make plans for someone to help with your regular activities and responsibilities while you are on bed rest.  Keep track of the number of pads you use each day, how often you change pads, and how soaked (saturated) they are. Write this down.  Do not use tampons. Do not douche.  Do not have sexual intercourse or orgasms until approved by your health care provider.  Follow your health care provider's advice about lifting, driving, and physical activities.  If you pass any tissue from your vagina, save the tissue so you can show it to your health care provider.   Only take over-the-counter or prescription medicines as directed by your health care provider.  Do not take aspirin because it can make you bleed.   Keep all follow-up appointments as directed by your health care provider. SEEK MEDICAL CARE IF:  You have any vaginal bleeding during any part of your pregnancy.  You have cramps or labor pains.  You have a fever, not controlled by medicine. SEEK IMMEDIATE MEDICAL CARE IF:   You have severe cramps or pain in your back or belly (abdomen).  You have chills.  You have a  gush of fluid from the vagina.  You pass large clots or tissue from your vagina.  Your bleeding increases.  You feel light-headed or weak.  You pass out.  You feel less movement or no movement of the baby.  MAKE SURE YOU:  Understand these instructions.  Will watch your condition.  Will get help right away if you are not doing well or get worse. Document Released: 06/21/2002 Document Revised: 04/05/2013 Document Reviewed: 12/06/2012 [facility] Patient Information 2015 [place], [place]. This information is not intended to replace advice given to you by your health care provider. Make sure you discuss any questions you have with your health care provider.

## 2014-01-16 NOTE — MAU Provider Note (Signed)
History     CSN: [id number]  Arrival date and time: 01/16/14 1551   None     Chief Complaint  Patient presents with  . Vaginal Bleeding   HPI This is a 33 y.o. female at [redacted]w[redacted]d who presents with c/o seeing blood when wiping.  Has had some intermittent sharp pains.  Denies nausea/vomiting or diarrhea. + FM  RN Note:   Pt states here for bleeding when she wiped post voiding. Did have "tightening' yesterday, and felt sharp pains intermittently today. No recent intercourse. No abnormal discharge prior to spotting today.        OB History   Grav Para Term Preterm Abortions TAB SAB Ect Mult Living   3 2 2       2       Past Medical History  Diagnosis Date  . Depression   . Anxiety     Past Surgical History  Procedure Laterality Date  . Cesarean section      x2  . Dental surgery  2010    6 teeth surgically removed    Family History  Problem Relation Age of Onset  . Diabetes Mother     Type 2  . Hypertension Mother   . Depression Father   . Diabetes Maternal Aunt   . Hypertension Maternal Aunt   . Hypertension Maternal Aunt     History  Substance Use Topics  . Smoking status: Never Smoker   . Smokeless tobacco: Not on file  . Alcohol Use: No    Allergies: No Known Allergies  Prescriptions prior to admission  Medication Sig Dispense Refill  . acetaminophen (TYLENOL) 325 MG tablet Take 325 mg by mouth every 6 (six) hours as needed for moderate pain.      . Prenatal Vit-Fe Fumarate-FA (PRENATAL MULTIVITAMIN) TABS tablet Take 1 tablet by mouth every other day.        Review of Systems  Constitutional: Negative for fever, chills and malaise/fatigue.  Gastrointestinal: Positive for abdominal pain (intermittent sharp pain). Negative for nausea and vomiting.  Genitourinary: Negative for dysuria, urgency and frequency.  Neurological: Negative for dizziness.   Physical Exam   Blood pressure 129/73, pulse 96, temperature 98.3 F (36.8 C), temperature source  Oral, resp. rate 16, height 5\' 5"  (1.651 m), weight 193 lb 4 oz (87.658 kg), last menstrual period 07/10/2013.  Physical Exam  Constitutional: She is oriented to person, place, and time. She appears well-developed and well-nourished. No distress.  HENT:  Head: Normocephalic.  Cardiovascular: Normal rate.   Respiratory: Effort normal.  GI: Soft.  Genitourinary: Vaginal discharge (thick white discharge, no blood seen) found.  Dilation: Closed Effacement (%): Thick Station: Ballotable Exam by:: Carmelia Roller CNM   Musculoskeletal: Normal range of motion.  Neurological: She is alert and oriented to person, place, and time.  Skin: Skin is warm and dry.  Psychiatric: She has a normal mood and affect.   Fetal heart rate reactive UCs irregular, initially every 3-6 minutes then spaced out over time FFn not done due to report of bleeding.   MAU Course  Procedures  MDM Discussed with Dr Harrington Challenger.  Will discharge home with instructions to hydrate,, since patient not feeling contractions and cervix remaining closed.   Assessment and Plan  A:  SIUP at [redacted]w[redacted]d       Intermittent contractions, not felt by patient      Report of bleeding at home, none seen on exam       Cervix long and closed  P:  Discharge home per Dr Harrington Challenger       PTL precautions       Encouraged to push PO fluids daily       Has appt this week. Keep MDs apprised of any further bleeding  [facility] 01/16/2014, 4:34 PM   EFM reviewed after patient discharged The tracing I saw when I was evaluating her was reactive and reassuring After that, there are two sharp variable decels noted on tracing with good return to baseline

## 2014-01-16 NOTE — MAU Note (Signed)
Pt states here for bleeding when she wiped post voiding. Did have "tightening' yesterday, and felt sharp pains intermittently today. No recent intercourse. No abnormal discharge prior to spotting today.

## 2014-02-13 ENCOUNTER — Encounter (HOSPITAL_COMMUNITY): Payer: Self-pay | Admitting: *Deleted

## 2014-03-02 ENCOUNTER — Telehealth: Payer: Self-pay | Admitting: *Deleted

## 2014-03-02 NOTE — Telephone Encounter (Signed)
Recall only. Patient referred to our office 09-2012 for ASCUS pap with positive HR HPV. Did not return for colpo. Returned for pregnancy confirmation and then transferred to [facility] care. Pap with OB 08-2013 was norrmal (result noted in EPIC).  OK to remove from recall?

## 2014-03-03 NOTE — Telephone Encounter (Signed)
Recall complete, encounter closed.

## 2014-03-03 NOTE — Telephone Encounter (Signed)
Yes.  Remove from Pap recall.

## 2014-03-23 ENCOUNTER — Other Ambulatory Visit: Payer: Self-pay

## 2014-03-23 LAB — OB RESULTS CONSOLE GBS: GBS: NEGATIVE

## 2014-03-31 ENCOUNTER — Encounter (HOSPITAL_COMMUNITY)
Admission: RE | Admit: 2014-03-31 | Discharge: 2014-03-31 | Disposition: A | Discharge status: Home / Self Care | Payer: Medicaid Other | Source: Ambulatory Visit | Attending: Obstetrics | Admitting: Obstetrics

## 2014-03-31 ENCOUNTER — Encounter (HOSPITAL_COMMUNITY): Payer: Self-pay

## 2014-03-31 DIAGNOSIS — Z01818 Encounter for other preprocedural examination: Secondary | ICD-10-CM | POA: Insufficient documentation

## 2014-03-31 HISTORY — DX: Gastro-esophageal reflux disease without esophagitis: K21.9

## 2014-03-31 HISTORY — DX: Personal history of other infectious and parasitic diseases: Z86.19

## 2014-03-31 LAB — CBC
HCT: 31.5 % — ABNORMAL LOW (ref 36.0–46.0)
Hemoglobin: 10.7 g/dL — ABNORMAL LOW (ref 12.0–15.0)
MCH: 29.2 pg (ref 26.0–34.0)
MCHC: 34 g/dL (ref 30.0–36.0)
MCV: 86.1 fL (ref 78.0–100.0)
PLATELETS: 226 10*3/uL (ref 150–400)
RBC: 3.66 MIL/uL — ABNORMAL LOW (ref 3.87–5.11)
RDW: 13 % (ref 11.5–15.5)
WBC: 10.4 10*3/uL (ref 4.0–10.5)

## 2014-03-31 LAB — TYPE AND SCREEN
ABO/RH(D): O POS
ANTIBODY SCREEN: NEGATIVE

## 2014-03-31 LAB — RPR

## 2014-03-31 NOTE — Patient Instructions (Signed)
Your procedure is scheduled on:  Monday, April 03, 2014  Enter through the Main Entrance of [facility] at: 10:30 a.m.  Pick up the phone at the desk and dial [phone].  Call this number if you have problems the morning of surgery: [phone].  Remember: Do NOT eat food:  After midnight Sunday Do NOT drink clear liquids after:  After 8:00 a.m. Monday Take these medicines the morning of surgery with a SIP OF WATER:  none  Do NOT wear jewelry (body piercing), metal hair clips/bobby pins, or nail polish. Do NOT wear lotions, powders, or perfumes.  You may wear deoderant. Do NOT shave for 48 hours prior to surgery. Do NOT bring valuables to the hospital. Leave suitcase in car.  After surgery it may be brought to your room.  For patients admitted to the hospital, checkout time is 11:00 AM the day of discharge.

## 2014-04-03 ENCOUNTER — Inpatient Hospital Stay (HOSPITAL_COMMUNITY): Payer: Medicaid Other | Admitting: Certified Registered Nurse Anesthetist

## 2014-04-03 ENCOUNTER — Encounter (HOSPITAL_COMMUNITY)
Admission: RE | Disposition: A | Discharge status: Home / Self Care | Payer: Self-pay | Source: Ambulatory Visit | Attending: Obstetrics

## 2014-04-03 ENCOUNTER — Inpatient Hospital Stay (HOSPITAL_COMMUNITY)
Admission: RE | Admit: 2014-04-03 | Discharge: 2014-04-06 | DRG: 766 | Disposition: A | Discharge status: Home / Self Care | Payer: Medicaid Other | Source: Ambulatory Visit | Attending: Obstetrics | Admitting: Obstetrics

## 2014-04-03 ENCOUNTER — Encounter (HOSPITAL_COMMUNITY): Payer: Self-pay | Admitting: Obstetrics

## 2014-04-03 DIAGNOSIS — Z3A39 39 weeks gestation of pregnancy: Secondary | ICD-10-CM | POA: Diagnosis present

## 2014-04-03 DIAGNOSIS — K219 Gastro-esophageal reflux disease without esophagitis: Secondary | ICD-10-CM | POA: Diagnosis present

## 2014-04-03 DIAGNOSIS — O3421 Maternal care for scar from previous cesarean delivery: Principal | ICD-10-CM | POA: Diagnosis present

## 2014-04-03 DIAGNOSIS — Z302 Encounter for sterilization: Secondary | ICD-10-CM

## 2014-04-03 DIAGNOSIS — Z98891 History of uterine scar from previous surgery: Secondary | ICD-10-CM

## 2014-04-03 SURGERY — Surgical Case
Anesthesia: Spinal | Site: Abdomen

## 2014-04-03 MED ORDER — INFLUENZA VAC SPLIT QUAD 0.5 ML IM SUSY
0.5000 mL | PREFILLED_SYRINGE | INTRAMUSCULAR | Status: AC
  Administered 2014-04-04: 0.5 mL via INTRAMUSCULAR
  Filled 2014-04-03: qty 0.5

## 2014-04-03 MED ORDER — DIPHENHYDRAMINE HCL 25 MG PO CAPS: 25.0000 mg | ORAL_CAPSULE | ORAL | Status: DC | PRN

## 2014-04-03 MED ORDER — ONDANSETRON HCL 4 MG/2ML IJ SOLN
INTRAMUSCULAR | Status: DC | PRN
  Administered 2014-04-03: 4 mg via INTRAVENOUS

## 2014-04-03 MED ORDER — ONDANSETRON HCL 4 MG/2ML IJ SOLN: 4.0000 mg | Freq: Three times a day (TID) | INTRAMUSCULAR | Status: DC | PRN

## 2014-04-03 MED ORDER — LACTATED RINGERS IV SOLN
INTRAVENOUS | Status: DC
  Administered 2014-04-03: via INTRAVENOUS

## 2014-04-03 MED ORDER — 0.9 % SODIUM CHLORIDE (POUR BTL) OPTIME
TOPICAL | Status: DC | PRN
  Administered 2014-04-03 (×2): 1000 mL

## 2014-04-03 MED ORDER — KETOROLAC TROMETHAMINE 30 MG/ML IJ SOLN: 30.0000 mg | Freq: Four times a day (QID) | INTRAMUSCULAR | Status: AC | PRN

## 2014-04-03 MED ORDER — DIPHENHYDRAMINE HCL 50 MG/ML IJ SOLN: 12.5000 mg | INTRAMUSCULAR | Status: DC | PRN

## 2014-04-03 MED ORDER — SENNOSIDES-DOCUSATE SODIUM 8.6-50 MG PO TABS
2.0000 | ORAL_TABLET | ORAL | Status: DC
  Administered 2014-04-03 – 2014-04-06 (×3): 2 via ORAL
  Filled 2014-04-03 (×3): qty 2

## 2014-04-03 MED ORDER — OXYCODONE-ACETAMINOPHEN 5-325 MG PO TABS
2.0000 | ORAL_TABLET | ORAL | Status: DC | PRN
  Administered 2014-04-04 – 2014-04-06 (×9): 2 via ORAL
  Filled 2014-04-03 (×9): qty 2

## 2014-04-03 MED ORDER — SCOPOLAMINE 1 MG/3DAYS TD PT72
1.0000 | MEDICATED_PATCH | Freq: Once | TRANSDERMAL | Status: DC
Start: 2014-04-03 — End: 2014-04-03
  Administered 2014-04-03: 1.5 mg via TRANSDERMAL

## 2014-04-03 MED ORDER — SIMETHICONE 80 MG PO CHEW
80.0000 mg | CHEWABLE_TABLET | Freq: Three times a day (TID) | ORAL | Status: DC
  Administered 2014-04-03 – 2014-04-06 (×8): 80 mg via ORAL
  Filled 2014-04-03 (×9): qty 1

## 2014-04-03 MED ORDER — LACTATED RINGERS IV SOLN
INTRAVENOUS | Status: DC
  Administered 2014-04-03: 11:00:00 via INTRAVENOUS

## 2014-04-03 MED ORDER — MENTHOL 3 MG MT LOZG: 1.0000 | LOZENGE | OROMUCOSAL | Status: DC | PRN

## 2014-04-03 MED ORDER — PHENYLEPHRINE 8 MG IN D5W 100 ML (0.08MG/ML) PREMIX OPTIME
INJECTION | INTRAVENOUS | Status: AC
  Filled 2014-04-03: qty 100

## 2014-04-03 MED ORDER — KETOROLAC TROMETHAMINE 30 MG/ML IJ SOLN
INTRAMUSCULAR | Status: AC
  Filled 2014-04-03: qty 1

## 2014-04-03 MED ORDER — SIMETHICONE 80 MG PO CHEW
80.0000 mg | CHEWABLE_TABLET | ORAL | Status: DC
  Administered 2014-04-03 – 2014-04-06 (×3): 80 mg via ORAL
  Filled 2014-04-03 (×3): qty 1

## 2014-04-03 MED ORDER — LANOLIN HYDROUS EX OINT: 1.0000 "application " | TOPICAL_OINTMENT | CUTANEOUS | Status: DC | PRN

## 2014-04-03 MED ORDER — MEPERIDINE HCL 25 MG/ML IJ SOLN: 6.2500 mg | INTRAMUSCULAR | Status: DC | PRN

## 2014-04-03 MED ORDER — SCOPOLAMINE 1 MG/3DAYS TD PT72
MEDICATED_PATCH | TRANSDERMAL | Status: AC
  Administered 2014-04-03: 1.5 mg via TRANSDERMAL
  Filled 2014-04-03: qty 1

## 2014-04-03 MED ORDER — LACTATED RINGERS IV SOLN
INTRAVENOUS | Status: DC
  Administered 2014-04-03 (×3): via INTRAVENOUS

## 2014-04-03 MED ORDER — MORPHINE SULFATE 0.5 MG/ML IJ SOLN
INTRAMUSCULAR | Status: AC
  Filled 2014-04-03: qty 10

## 2014-04-03 MED ORDER — OXYTOCIN 40 UNITS IN LACTATED RINGERS INFUSION - SIMPLE MED: 62.5000 mL/h | INTRAVENOUS | Status: AC

## 2014-04-03 MED ORDER — TETANUS-DIPHTH-ACELL PERTUSSIS 5-2.5-18.5 LF-MCG/0.5 IM SUSP
0.5000 mL | Freq: Once | INTRAMUSCULAR | Status: AC
  Administered 2014-04-04: 0.5 mL via INTRAMUSCULAR
  Filled 2014-04-03: qty 0.5

## 2014-04-03 MED ORDER — NALOXONE HCL 0.4 MG/ML IJ SOLN: 0.4000 mg | INTRAMUSCULAR | Status: DC | PRN

## 2014-04-03 MED ORDER — PROMETHAZINE HCL 25 MG/ML IJ SOLN: 6.2500 mg | INTRAMUSCULAR | Status: DC | PRN

## 2014-04-03 MED ORDER — OXYTOCIN 10 UNIT/ML IJ SOLN
INTRAMUSCULAR | Status: AC
  Filled 2014-04-03: qty 4

## 2014-04-03 MED ORDER — OXYCODONE-ACETAMINOPHEN 5-325 MG PO TABS
1.0000 | ORAL_TABLET | ORAL | Status: DC | PRN
  Administered 2014-04-04 – 2014-04-05 (×2): 1 via ORAL
  Filled 2014-04-03 (×4): qty 1

## 2014-04-03 MED ORDER — IBUPROFEN 600 MG PO TABS
600.0000 mg | ORAL_TABLET | Freq: Four times a day (QID) | ORAL | Status: DC
  Administered 2014-04-03 – 2014-04-06 (×10): 600 mg via ORAL
  Filled 2014-04-03 (×11): qty 1

## 2014-04-03 MED ORDER — LACTATED RINGERS IV SOLN
INTRAVENOUS | Status: DC | PRN
  Administered 2014-04-03: 13:00:00 via INTRAVENOUS

## 2014-04-03 MED ORDER — HYDROMORPHONE HCL 1 MG/ML IJ SOLN
1.0000 mg | Freq: Once | INTRAMUSCULAR | Status: AC
  Administered 2014-04-03: 1 mg via INTRAVENOUS
  Filled 2014-04-03: qty 1

## 2014-04-03 MED ORDER — CEFAZOLIN SODIUM-DEXTROSE 2-3 GM-% IV SOLR
INTRAVENOUS | Status: AC
  Filled 2014-04-03: qty 50

## 2014-04-03 MED ORDER — ONDANSETRON HCL 4 MG PO TABS: 4.0000 mg | ORAL_TABLET | ORAL | Status: DC | PRN

## 2014-04-03 MED ORDER — NALBUPHINE HCL 10 MG/ML IJ SOLN: 5.0000 mg | Freq: Once | INTRAMUSCULAR | Status: AC | PRN

## 2014-04-03 MED ORDER — NALBUPHINE HCL 10 MG/ML IJ SOLN: 5.0000 mg | INTRAMUSCULAR | Status: DC | PRN

## 2014-04-03 MED ORDER — FENTANYL CITRATE 0.05 MG/ML IJ SOLN
INTRAMUSCULAR | Status: DC | PRN
  Administered 2014-04-03: 12.5 ug via INTRATHECAL

## 2014-04-03 MED ORDER — ONDANSETRON HCL 4 MG/2ML IJ SOLN: 4.0000 mg | INTRAMUSCULAR | Status: DC | PRN

## 2014-04-03 MED ORDER — WITCH HAZEL-GLYCERIN EX PADS: 1.0000 "application " | MEDICATED_PAD | CUTANEOUS | Status: DC | PRN

## 2014-04-03 MED ORDER — NALBUPHINE HCL 10 MG/ML IJ SOLN
5.0000 mg | Freq: Once | INTRAMUSCULAR | Status: AC | PRN
Start: 2014-04-03 — End: 2014-04-03

## 2014-04-03 MED ORDER — DIPHENHYDRAMINE HCL 25 MG PO CAPS: 25.0000 mg | ORAL_CAPSULE | Freq: Four times a day (QID) | ORAL | Status: DC | PRN

## 2014-04-03 MED ORDER — PHENYLEPHRINE 8 MG IN D5W 100 ML (0.08MG/ML) PREMIX OPTIME
INJECTION | INTRAVENOUS | Status: DC | PRN
  Administered 2014-04-03: 60 ug/min via INTRAVENOUS

## 2014-04-03 MED ORDER — PRENATAL MULTIVITAMIN CH
1.0000 | ORAL_TABLET | Freq: Every day | ORAL | Status: DC
  Administered 2014-04-04 – 2014-04-06 (×3): 1 via ORAL
  Filled 2014-04-03 (×3): qty 1

## 2014-04-03 MED ORDER — NALOXONE HCL 1 MG/ML IJ SOLN: 1.0000 ug/kg/h | INTRAVENOUS | Status: DC | PRN

## 2014-04-03 MED ORDER — DIBUCAINE 1 % RE OINT: 1.0000 "application " | TOPICAL_OINTMENT | RECTAL | Status: DC | PRN

## 2014-04-03 MED ORDER — MORPHINE SULFATE (PF) 0.5 MG/ML IJ SOLN
INTRAMUSCULAR | Status: DC | PRN
  Administered 2014-04-03: .2 mg via INTRATHECAL

## 2014-04-03 MED ORDER — OXYTOCIN 40 UNITS IN LACTATED RINGERS INFUSION - SIMPLE MED
INTRAVENOUS | Status: DC | PRN
  Administered 2014-04-03: 40 [IU] via INTRAVENOUS

## 2014-04-03 MED ORDER — ONDANSETRON HCL 4 MG/2ML IJ SOLN
INTRAMUSCULAR | Status: AC
  Filled 2014-04-03: qty 2

## 2014-04-03 MED ORDER — SIMETHICONE 80 MG PO CHEW
80.0000 mg | CHEWABLE_TABLET | ORAL | Status: DC | PRN
  Administered 2014-04-06: 80 mg via ORAL
  Filled 2014-04-03: qty 1

## 2014-04-03 MED ORDER — KETOROLAC TROMETHAMINE 30 MG/ML IJ SOLN
30.0000 mg | Freq: Four times a day (QID) | INTRAMUSCULAR | Status: AC | PRN
  Administered 2014-04-03: 30 mg via INTRAVENOUS

## 2014-04-03 MED ORDER — SODIUM CHLORIDE 0.9 % IJ SOLN: 3.0000 mL | INTRAMUSCULAR | Status: DC | PRN

## 2014-04-03 MED ORDER — FENTANYL CITRATE 0.05 MG/ML IJ SOLN
INTRAMUSCULAR | Status: AC
  Filled 2014-04-03: qty 2

## 2014-04-03 MED ORDER — HYDROMORPHONE HCL 1 MG/ML IJ SOLN: 0.2500 mg | INTRAMUSCULAR | Status: DC | PRN

## 2014-04-03 MED ORDER — IBUPROFEN 600 MG PO TABS: 600.0000 mg | ORAL_TABLET | Freq: Four times a day (QID) | ORAL | Status: DC | PRN

## 2014-04-03 MED ORDER — CEFAZOLIN SODIUM-DEXTROSE 2-3 GM-% IV SOLR
2.0000 g | INTRAVENOUS | Status: AC
  Administered 2014-04-03: 2 g via INTRAVENOUS

## 2014-04-03 MED ORDER — BUPIVACAINE IN DEXTROSE 0.75-8.25 % IT SOLN
INTRATHECAL | Status: DC | PRN
  Administered 2014-04-03: 1.5 mL via INTRATHECAL

## 2014-04-03 SURGICAL SUPPLY — 35 items
BENZOIN TINCTURE PRP APPL 2/3 (GAUZE/BANDAGES/DRESSINGS) ×2 IMPLANT
CLAMP CORD UMBIL (MISCELLANEOUS) ×2 IMPLANT
CLOTH BEACON ORANGE TIMEOUT ST (SAFETY) ×2 IMPLANT
DRAPE SHEET LG 3/4 BI-LAMINATE (DRAPES) ×2 IMPLANT
DRSG OPSITE POSTOP 4X10 (GAUZE/BANDAGES/DRESSINGS) ×2 IMPLANT
DURAPREP 26ML APPLICATOR (WOUND CARE) ×2 IMPLANT
ELECT REM PT RETURN 9FT ADLT (ELECTROSURGICAL) ×2
ELECTRODE REM PT RTRN 9FT ADLT (ELECTROSURGICAL) ×1 IMPLANT
GLOVE BIO SURGEON STRL SZ 6 (GLOVE) ×2 IMPLANT
GLOVE INDICATOR 6.0 STRL GRN (GLOVE) ×2 IMPLANT
GOWN STRL REUS W/TWL LRG LVL3 (GOWN DISPOSABLE) ×4 IMPLANT
HEMOSTAT SURGICEL 2X14 (HEMOSTASIS) ×2 IMPLANT
LIQUID BAND (GAUZE/BANDAGES/DRESSINGS) IMPLANT
NS IRRIG 1000ML POUR BTL (IV SOLUTION) ×4 IMPLANT
PACK C SECTION WH (CUSTOM PROCEDURE TRAY) ×2 IMPLANT
PAD ABD 7.5X8 STRL (GAUZE/BANDAGES/DRESSINGS) ×2 IMPLANT
PAD OB MATERNITY 4.3X12.25 (PERSONAL CARE ITEMS) ×2 IMPLANT
SPONGE GAUZE 4X4 12PLY STER LF (GAUZE/BANDAGES/DRESSINGS) ×4 IMPLANT
SPONGE LAP 18X18 X RAY DECT (DISPOSABLE) ×4 IMPLANT
STRIP CLOSURE SKIN 1/2X4 (GAUZE/BANDAGES/DRESSINGS) ×2 IMPLANT
SUT MNCRL 0 VIOLET CTX 36 (SUTURE) ×3 IMPLANT
SUT MNCRL AB 3-0 PS2 27 (SUTURE) ×2 IMPLANT
SUT MONOCRYL 0 CTX 36 (SUTURE) ×3
SUT PLAIN 0 NONE (SUTURE) ×2 IMPLANT
SUT PLAIN 2 0 (SUTURE) ×1
SUT PLAIN ABS 2-0 CT1 27XMFL (SUTURE) ×1 IMPLANT
SUT VIC AB 0 CTX 36 (SUTURE) ×2
SUT VIC AB 0 CTX36XBRD ANBCTRL (SUTURE) ×2 IMPLANT
SUT VIC AB 2-0 CT1 27 (SUTURE) ×1
SUT VIC AB 2-0 CT1 TAPERPNT 27 (SUTURE) ×1 IMPLANT
TAPE CLOTH SURG 4X10 WHT LF (GAUZE/BANDAGES/DRESSINGS) ×2 IMPLANT
TOWEL OR 17X24 6PK STRL BLUE (TOWEL DISPOSABLE) ×2 IMPLANT
TRAY FOLEY CATH 14FR (SET/KITS/TRAYS/PACK) ×2 IMPLANT
WATER STERILE IRR 1000ML POUR (IV SOLUTION) ×2 IMPLANT
YANKAUER SUCT BULB TIP NO VENT (SUCTIONS) ×2 IMPLANT

## 2014-04-03 NOTE — Brief Op Note (Addendum)
04/03/2014  1:43 PM  PATIENT:  Sherry [name]  33 y.o. female  PRE-OPERATIVE DIAGNOSIS:  REPEAT / DESIRES STERILIZATION  POST-OPERATIVE DIAGNOSIS:  REPEAT / DESIRES STERILIZATION  PROCEDURE:  Procedure(s): CESAREAN SECTION WITH BILATERAL TUBAL LIGATION (N/A)  SURGEON:  Surgeon(s) and Role:    * Jerelyn Charles, MD - Primary  PHYSICIAN ASSISTANT:  Allyn Kenner, DO   ANESTHESIA:   spinal  EBL:  Total I/O In: 2500 [I.V.:2500] Out: 1600 [Urine:500; Blood:1100]  BLOOD ADMINISTERED:none  DRAINS: none   LOCAL MEDICATIONS USED:  NONE  SPECIMEN:  Source of Specimen:  placenta, portion of bilateral fallopian tubes  DISPOSITION OF SPECIMEN: placenta to L&D, portion of bilateral fallopian tubes to pathology  COUNTS:  YES  TOURNIQUET:  * No tourniquets in log *  DICTATION: .Note written in EPIC  PLAN OF CARE: Admit to inpatient   PATIENT DISPOSITION:  PACU - hemodynamically stable.   Delay start of Pharmacological VTE agent (>24hrs) due to surgical blood loss or risk of bleeding: not applicable

## 2014-04-03 NOTE — Anesthesia Preprocedure Evaluation (Signed)
Anesthesia Evaluation  Patient identified by MRN, date of birth, ID band Patient awake    Reviewed: Allergy & Precautions, H&P , NPO status , Patient's Chart, lab work & pertinent test results  Airway Mallampati: II  TM Distance: >3 FB Neck ROM: full    Dental no notable dental hx.    Pulmonary neg pulmonary ROS,    Pulmonary exam normal       Cardiovascular negative cardio ROS      Neuro/Psych Anxiety Depression negative neurological ROS     GI/Hepatic negative GI ROS, Neg liver ROS,   Endo/Other  negative endocrine ROS  Renal/GU negative Renal ROS     Musculoskeletal   Abdominal Normal abdominal exam  (+)   Peds  Hematology negative hematology ROS (+)   Anesthesia Other Findings   Reproductive/Obstetrics (+) Pregnancy                             Anesthesia Physical Anesthesia Plan  ASA: II  Anesthesia Plan: Spinal   Post-op Pain Management:    Induction:   Airway Management Planned:   Additional Equipment:   Intra-op Plan:   Post-operative Plan:   Informed Consent: I have reviewed the patients History and Physical, chart, labs and discussed the procedure including the risks, benefits and alternatives for the proposed anesthesia with the patient or authorized representative who has indicated his/her understanding and acceptance.     Plan Discussed with: CRNA and Surgeon  Anesthesia Plan Comments:         Anesthesia Quick Evaluation

## 2014-04-03 NOTE — Transfer of Care (Signed)
Immediate Anesthesia Transfer of Care Note  Patient: Sherry [name]  Procedure(s) Performed: Procedure(s): CESAREAN SECTION WITH BILATERAL TUBAL LIGATION (N/A)  Patient Location: PACU  Anesthesia Type:Spinal  Level of Consciousness: awake, alert , oriented and patient cooperative  Airway & Oxygen Therapy: Patient Spontanous Breathing  Post-op Assessment: Report given to PACU RN and Post -op Vital signs reviewed and stable  Post vital signs: Reviewed and stable  Complications: No apparent anesthesia complications

## 2014-04-03 NOTE — Anesthesia Postprocedure Evaluation (Signed)
Anesthesia Post Note  Patient: Sherry [name]  Procedure(s) Performed: Procedure(s) (LRB): CESAREAN SECTION WITH BILATERAL TUBAL LIGATION (N/A)  Anesthesia type: Spinal  Patient location: PACU  Post pain: Pain level controlled  Post assessment: Post-op Vital signs reviewed  Last Vitals:  Filed Vitals:   04/03/14 1445  BP:   Pulse: 74  Temp: 36.6 C  Resp: 15    Post vital signs: Reviewed  Level of consciousness: awake  Complications: No apparent anesthesia complications

## 2014-04-03 NOTE — Anesthesia Procedure Notes (Signed)
Spinal Patient location during procedure: OR Start time: 04/03/2014 11:53 AM End time: 04/03/2014 11:56 AM Staffing Anesthesiologist: Lyn Hollingshead Performed by: anesthesiologist  Preanesthetic Checklist Completed: patient identified, surgical consent, pre-op evaluation, timeout performed, IV checked, risks and benefits discussed and monitors and equipment checked Spinal Block Patient position: sitting Prep: DuraPrep Patient monitoring: heart rate, cardiac monitor, continuous pulse ox and blood pressure Approach: midline Location: L3-4 Injection technique: single-shot Needle Needle type: Pencan  Needle gauge: 24 G Needle length: 9 cm Assessment Sensory level: T4

## 2014-04-03 NOTE — Op Note (Signed)
Cesarean Section Procedure Note  Pre-operative Diagnosis: 1. Intrauterine pregnancy at [redacted]w[redacted]d  2. Prior cesarean delivery 3. Desires permanent sterilization  Post-operative Diagnosis: same as above  Surgeon: Jerelyn Charles, MD  Assistants: Allyn Kenner, DO  Procedure: Repeat low transverse cesarean section with pomeroy bilateral tubal ligation  Anesthesia: Spinal anesthesia  Estimated Blood Loss: 1100 mL         Drains: Foley catheter         Specimens: portion of bilateral fallopian tubes to pathology         Implants: none         Complications:  None; patient tolerated the procedure well.         Disposition: PACU - hemodynamically stable.  Findings:    Significant adhesions of the uterus to the anterior abdominal wall.  The midportion of the uterus was not fully dissected off of the anterior wasll and could not be exteriorized.  For the tubal ligation, the fimbirated ends of the tubes were identified, but the ovaries not examined in their entirety. A pomeroy tubal ligation was performed.  Viable female infant, 3520 g (7lb 12oz) Apgars 8, 9 .    Procedure Details   The patient was counseled about the risks, benefits, complications of the cesarean section as well as the permanent nature of a tubal ligation.  She elected to proceed with a tubal ligation. Consent was obtained.    After spinal anesthesia was found to adequate , the patient was placed in the dorsal supine position with a leftward tilt, draped and prepped in the usual sterile manner. A Pfannenstiel incision was made and carried down through the subcutaneous tissue to the fascia.  The fascia was incised in the midline and the fascial incision was extended laterally with Mayo scissors. The superior aspect of the fascial incision was grasped with two Kocher clamp, tented up and the rectus muscles dissected off sharply. The rectus was then dissected off with blunt dissection and Mayo scissors inferiorly. The superior midline  aspect of the fascia was noted to be well adhered to the anterior uterus.  The peritoneum was entered bluntly laterally.  The uterine fundus was free. Combination of sharp dissection and bovie cautery was used to free the lateral edges of the uterus. The midline of the anterior abdominal wall was still adhered to the anterior aspect of the uterus, but there was felt to be sufficient space for delivery of the fetus. The bladder was noted to be well away from the lower uterine segment.   A low transverse hysterotomy was made with the scalpel.  Amniotomy was performed, and the fluid was clear. The fetal vertex was identified and brought to the hysterotomy.  The shoulders and body were delivered without difficulty. The cord was clamped and cut and the infant was passed to the waiting neonatologist.  Placenta was then delivered spontaneously, intact and appear normal, the uterus was cleared of all clot and debris.   The hysterotomy was repaired with #0 Monocryl in running locked fashion. A second imbricating layer with #0 Monocryl was placed. An additional figure of eight suture was placed for hemostasis.  At this time, attention was turned to the left fallopian tube.  The tube was grasped with two babcock clamps.  An avascular segment of the mesosalpinx was identified, and a modified Pomeroy tubal ligation was performed in the usual fashion with #0 plain gut suture.  Tubal ostia were identified on both remaining sides of the tube.  This was repeated  with the right fallopian tube. The portion of the left and right fallopian tubes were sent to pathology.   The serosal edges of the incision as well as the serosal edges that were previously adherent to the anterior abdominal wall were oozy, and bovie cautery was used to achieve hemostasis.  The hysterotomy was reexamined and hemostasis was noted. Given the multiple raw edges of the serosa due to extensive adhesive disease, surgicel was placed over the hysterotomy.  The  abdominal cavity was cleared of all clot and debris. The fascia and rectus muscles were inspected and were hemostatic. The fascia was closed with 0 Vicryl in a running fashion. The subcuticular layer was irrigated and all bleeders cauterized.  The peritoneum was closed with 2-0 vicryl in a running fashion. The subcutaneous layer was re approximated with running 3-0 plain gut.  The skin was closed with 3-0 monocryl in a subcuticular fashion. The incision was dressed with benzoine, steri strips and pressure dressing. All sponge lap and needle counts were correct x3. Patient tolerated the procedure well and recovered in stable condition following the procedure.

## 2014-04-03 NOTE — H&P (Signed)
33 y.o. [id number] @ [redacted]w[redacted]d presents for an elective repeat cesarean section and bilateral tubal ligation.  Otherwise has good fetal movement and no bleeding.  Past Medical History  Diagnosis Date  . Depression   . Anxiety   . H/O chlamydia infection   . GERD (gastroesophageal reflux disease)     with pregnancy    Past Surgical History  Procedure Laterality Date  . Cesarean section      x2  . Dental surgery  2010    6 teeth surgically removed  . Bartholin cyst marsupialization      OB History  Gravida Para Term Preterm AB SAB TAB Ectopic Multiple Living  3 2 2       2     # Outcome Date GA Lbr Len/2nd Weight Sex Delivery Anes PTL Lv  3 Current           2 Term 04/2010 [redacted]w[redacted]d  4.026 kg (8 lb 14 oz) F CS-Unspec   Y  1 Term 04/2002 [redacted]w[redacted]d  3.544 kg (7 lb 13 oz) M CS-Unspec         History   Social History  . Marital Status: Married    Spouse Name: N/A    Number of Children: N/A  . Years of Education: N/A   Occupational History  . Not on file.   Social History Main Topics  . Smoking status: Never Smoker   . Smokeless tobacco: Not on file  . Alcohol Use: Yes     Comment: occassional before pregnancy  . Drug Use: No  . Sexual Activity:    Partners: Male   Other Topics Concern  . Not on file   Social History Narrative            Review of patient's allergies indicates no known allergies.    Prenatal Transfer Tool  Maternal Diabetes: No Genetic Screening: Normal Maternal Ultrasounds/Referrals: Normal Fetal Ultrasounds or other Referrals:  None Maternal Substance Abuse:  No Significant Maternal Medications:  None Significant Maternal Lab Results: Lab values include: Group B Strep negative  ABO, Rh: --/--/O POS (12/18 1120) Antibody: NEG (12/18 1120) Rubella:  Immune RPR: NON REAC (12/18 1120)  HBsAg: Negative (06/16 0000)  HIV: Non-reactive (06/16 0000)  GBS: Negative (12/10 0000)    Other PNC: uncomplicated.    Filed Vitals:   04/03/14 1030  BP:  121/81  Pulse: 88  Temp: 97.9 F (36.6 C)  Resp: 20     General:  NAD Abdomen:  soft, gravid, vertex FHTs 141   Lab Results  Component Value Date   WBC 10.4 03/31/2014   HGB 10.7* 03/31/2014   HCT 31.5* 03/31/2014   MCV 86.1 03/31/2014   PLT 226 03/31/2014     A/P   33 y.o. [redacted]w[redacted]d  [name] presents for repeat cesarean delivery and bilateral tubal ligation.  Reviewed r/b/a, including risk of infection, bleeding, damage to surrounding structures, including bowel, bladder, ovaries, nerves, vessels, baby, need for blood transfusion, need for additional procedures, risk of regret of tubal, permanent nature of surgical sterilization, risk of failure resulting in pregnancy, including ectopic.  She elects to proceed.  Consent signed.   FSR/ vtx/ GBS neg  [place], [facility]

## 2014-04-04 ENCOUNTER — Encounter (HOSPITAL_COMMUNITY): Payer: Self-pay | Admitting: Obstetrics

## 2014-04-04 ENCOUNTER — Inpatient Hospital Stay: Admit: 2014-04-04 | Payer: Self-pay | Admitting: Obstetrics and Gynecology

## 2014-04-04 LAB — CBC
HEMATOCRIT: 27.2 % — AB (ref 36.0–46.0)
Hemoglobin: 9.1 g/dL — ABNORMAL LOW (ref 12.0–15.0)
MCH: 29.2 pg (ref 26.0–34.0)
MCHC: 33.5 g/dL (ref 30.0–36.0)
MCV: 87.2 fL (ref 78.0–100.0)
PLATELETS: 199 10*3/uL (ref 150–400)
RBC: 3.12 MIL/uL — ABNORMAL LOW (ref 3.87–5.11)
RDW: 13.1 % (ref 11.5–15.5)
WBC: 12.2 10*3/uL — ABNORMAL HIGH (ref 4.0–10.5)

## 2014-04-04 LAB — BIRTH TISSUE RECOVERY COLLECTION (PLACENTA DONATION)

## 2014-04-04 SURGERY — Surgical Case
Anesthesia: Regional | Laterality: Bilateral

## 2014-04-04 NOTE — Addendum Note (Signed)
Addendum  created 04/04/14 0745 by Asher Muir, CRNA   Modules edited: Notes Section   Notes Section:  File: [id number]

## 2014-04-04 NOTE — Progress Notes (Signed)
POD#1 Pt without complaints. Lochia-wnl Adequate pain control VSSAF IMP/ Doing well. Plan/ Will shower today.

## 2014-04-04 NOTE — Anesthesia Postprocedure Evaluation (Signed)
Anesthesia Post Note  Patient: Sherry [name]  Procedure(s) Performed: Procedure(s) (LRB): CESAREAN SECTION WITH BILATERAL TUBAL LIGATION (N/A)  Anesthesia type: Spinal  Patient location: Mother/Baby  Post pain: Pain level controlled  Post assessment: Post-op Vital signs reviewed  Last Vitals:  Filed Vitals:   04/04/14 0603  BP: 114/52  Pulse: 74  Temp: 37 C  Resp: 20    Post vital signs: Reviewed  Level of consciousness: awake  Complications: No apparent anesthesia complications

## 2014-04-04 NOTE — Progress Notes (Signed)
MOB was referred for history of depression/anxiety.  Referral is screened out by Clinical Social Worker because none of the following criteria appear to apply: -History of anxiety/depression during this pregnancy, or of post-partum depression. - Diagnosis of anxiety and/or depression within last 3 years - History of depression due to pregnancy loss/loss of child or -MOB's symptoms are currently being treated with medication and/or therapy.  CSW completed chart review and noted that MOB received diagnosis more than 3 years ago (history of depression and anxiety since adolescence).  Medical Records in February and March document participation in therapy.  CSW also noted that the MOB has indicated intentions to re-start Celexa now that baby has been born.   Please contact the Clinical Social Worker if needs arise or upon MOB request.

## 2014-04-05 MED ORDER — MAGNESIUM HYDROXIDE 400 MG/5ML PO SUSP
30.0000 mL | ORAL | Status: DC | PRN
  Administered 2014-04-05: 30 mL via ORAL
  Filled 2014-04-05: qty 30

## 2014-04-05 NOTE — Progress Notes (Signed)
Subjective: Postpartum Day 2: Cesarean Delivery Patient reports + flatus.  Notes some increased gas discomfort. Eating without nausea or vomiting. Bleeding appropriate. Ambulating and urinating without difficulty  Objective: Vital signs in last 24 hours: Temp:  [97.8 F (36.6 C)-98.1 F (36.7 C)] 97.8 F (36.6 C) (12/23 1732) Pulse Rate:  [66-91] 91 (12/23 1732) Resp:  [18] 18 (12/23 1732) BP: (114-119)/(68-72) 117/68 mmHg (12/23 1732)  Physical Exam:  General: alert, cooperative and appears stated age Lochia: appropriate Uterine Fundus: firm Incision: pressure dressing clean, dry, intact   Recent Labs  04/04/14 0525  HGB 9.1*  HCT 27.2*    Assessment/Plan: Status post Cesarean section. Doing well postoperatively.  Continue current care. Discussed showering and removing outer bandage today. Pt desires one additional day in hospital. Anticipate D/C tomorrow  Sergi Gellner H. 04/05/2014, 9:05 PM

## 2014-04-06 MED ORDER — OXYCODONE-ACETAMINOPHEN 5-325 MG PO TABS: 1.0000 | ORAL_TABLET | ORAL | Status: DC | PRN

## 2014-04-06 NOTE — Progress Notes (Signed)
POD#3 Pt without complaints. She would like to go home.  VSSAF  IMP/PO stable PLAN/ Will will discharge.                                       vssaf

## 2014-04-06 NOTE — Discharge Summary (Signed)
Obstetric Discharge Summary Reason for Admission: cesarean section Prenatal Procedures: ultrasound Intrapartum Procedures: cesarean: low cervical, transverse and tubal ligation Postpartum Procedures: none Complications-Operative and Postpartum: none HEMOGLOBIN  Date Value Ref Range Status  04/04/2014 9.1* 12.0 - 15.0 g/dL Final   HCT  Date Value Ref Range Status  04/04/2014 27.2* 36.0 - 46.0 % Final    Physical Exam:  General: alert Lochia: appropriate Uterine Fundus: firm Incision: healing well DVT Evaluation: No evidence of DVT seen on physical exam.  Discharge Diagnoses: Term Pregnancy-delivered  Discharge Information: Date: 04/06/2014 Activity: pelvic rest Diet: routine Medications: PNV, Ibuprofen and Percocet Condition: stable Instructions: refer to practice specific booklet Discharge to: home Follow-up Information    Follow up with Jerelyn Charles, MD. Schedule an appointment as soon as possible for a visit today.   Specialty:  Obstetrics   Contact information:   [address] [place] [address] [phone]       Newborn Data: Live born female  Birth Weight: 7 lb 12.2 oz (3520 g) APGAR: 8, 9  Home with mother.  ANDERSON,MARK E 04/06/2014, 9:00 AM

## 2014-11-08 ENCOUNTER — Encounter (HOSPITAL_COMMUNITY): Payer: Self-pay | Admitting: *Deleted

## 2014-11-08 ENCOUNTER — Emergency Department (HOSPITAL_COMMUNITY)
Admission: EM | Admit: 2014-11-08 | Discharge: 2014-11-09 | Disposition: A | Discharge status: Home / Self Care | Payer: 59 | Attending: Emergency Medicine | Admitting: Emergency Medicine

## 2014-11-08 DIAGNOSIS — N939 Abnormal uterine and vaginal bleeding, unspecified: Secondary | ICD-10-CM | POA: Insufficient documentation

## 2014-11-08 DIAGNOSIS — Z8619 Personal history of other infectious and parasitic diseases: Secondary | ICD-10-CM | POA: Insufficient documentation

## 2014-11-08 DIAGNOSIS — Z8659 Personal history of other mental and behavioral disorders: Secondary | ICD-10-CM | POA: Insufficient documentation

## 2014-11-08 LAB — COMPREHENSIVE METABOLIC PANEL
ALT: 13 U/L — AB (ref 14–54)
AST: 21 U/L (ref 15–41)
Albumin: 3.4 g/dL — ABNORMAL LOW (ref 3.5–5.0)
Alkaline Phosphatase: 66 U/L (ref 38–126)
Anion gap: 6 (ref 5–15)
BILIRUBIN TOTAL: 0.6 mg/dL (ref 0.3–1.2)
BUN: 8 mg/dL (ref 6–20)
CHLORIDE: 107 mmol/L (ref 101–111)
CO2: 26 mmol/L (ref 22–32)
Calcium: 8.3 mg/dL — ABNORMAL LOW (ref 8.9–10.3)
Creatinine, Ser: 0.82 mg/dL (ref 0.44–1.00)
GFR calc Af Amer: >60 mL/min (ref >60)
GLUCOSE: 89 mg/dL (ref 65–99)
Potassium: 3.9 mmol/L (ref 3.5–5.1)
SODIUM: 139 mmol/L (ref 135–145)
TOTAL PROTEIN: 6.5 g/dL (ref 6.5–8.1)

## 2014-11-08 LAB — CBC
HCT: 26.5 % — ABNORMAL LOW (ref 36.0–46.0)
Hemoglobin: 8.8 g/dL — ABNORMAL LOW (ref 12.0–15.0)
MCH: 28.9 pg (ref 26.0–34.0)
MCHC: 33.2 g/dL (ref 30.0–36.0)
MCV: 87.2 fL (ref 78.0–100.0)
Platelets: 310 10*3/uL (ref 150–400)
RBC: 3.04 MIL/uL — ABNORMAL LOW (ref 3.87–5.11)
RDW: 13.4 % (ref 11.5–15.5)
WBC: 5.2 10*3/uL (ref 4.0–10.5)

## 2014-11-08 LAB — URINALYSIS, ROUTINE W REFLEX MICROSCOPIC
BILIRUBIN URINE: NEGATIVE
Glucose, UA: NEGATIVE mg/dL
Ketones, ur: NEGATIVE mg/dL
LEUKOCYTES UA: NEGATIVE
Nitrite: NEGATIVE
PROTEIN: NEGATIVE mg/dL
Specific Gravity, Urine: 1.025 (ref 1.005–1.030)
Urobilinogen, UA: 0.2 mg/dL (ref 0.0–1.0)
pH: 6.5 (ref 5.0–8.0)

## 2014-11-08 LAB — URINE MICROSCOPIC-ADD ON

## 2014-11-08 LAB — LIPASE, BLOOD: Lipase: 27 U/L (ref 22–51)

## 2014-11-08 LAB — I-STAT BETA HCG BLOOD, ED (MC, WL, AP ONLY)

## 2014-11-08 NOTE — ED Provider Notes (Signed)
CSN: [id number]     Arrival date & time 11/08/14  1122 History   First MD Initiated Contact with Patient 11/08/14 1307     Chief Complaint  Patient presents with  . Abdominal Pain     (Consider location/radiation/quality/duration/timing/severity/associated sxs/prior Treatment) The history is provided by the patient. No language interpreter was used.  Sherry [name] is a 34yo [id number] with PMH of depression and three C-sections who presents with abnormal uterine bleeding for the past 1 month and pelvic pain since her last C-section 7 months ago. She had a tubal ligation and C-section in 03/2014, her third C-section, with an otherwise uncomplicated pregnancy, and since then, she has had persistent mild, pressure-like pelvic pain in a band like distribution in her lower abdomen, relieved only by lying down. She has tried NSAIDS to no relief. Nothing seems to exacerbate it. She had some mild uterine bleeding immediately after the C-section, but then had no bleeding for 6 months until roughly 1 month ago, where she began bleeding consistently, progressively more each day where now she soaks 1 pad per hour on average. She also notes fatigue, but she has no other symptoms at this time, including fever, headache, chest pain, palpitations, shortness of breath, abdominal pain, bowel or urinary symptoms, history of easy bleeding/disorders, vaginal pain, discharge, foul odor, or contractions. Her last menstrual period was before her last pregnancy in 2015, she has been sexually active without protection with the same partner since having her tubal ligation, and uses no other methods of contraception. She does have a history of chlamydia, which she completed treatment for a few years ago.  Of note, regarding her last pap: it appears she has a history of ASCUS with +HR-HPV in 10/2012, where she did go to the follow-up appointment, but did not follow-up for colposcopy.   Past Medical History  Diagnosis Date  .  Depression   . Anxiety   . H/O chlamydia infection   . GERD (gastroesophageal reflux disease)     with pregnancy   Past Surgical History  Procedure Laterality Date  . Cesarean section      x2  . Dental surgery  2010    6 teeth surgically removed  . Bartholin cyst marsupialization    . Cesarean section with bilateral tubal ligation N/A 04/03/2014    Procedure: CESAREAN SECTION WITH BILATERAL TUBAL LIGATION;  Surgeon: Jerelyn Charles, MD;  Location: [facility];  Service: Obstetrics;  Laterality: N/A;   Family History  Problem Relation Age of Onset  . Diabetes Mother     Type 2  . Hypertension Mother   . Depression Father   . Diabetes Maternal Aunt   . Hypertension Maternal Aunt   . Hypertension Maternal Aunt    History  Substance Use Topics  . Smoking status: Never Smoker   . Smokeless tobacco: Not on file  . Alcohol Use: Yes     Comment: occassional before pregnancy   OB History    Gravida Para Term Preterm AB TAB SAB Ectopic Multiple Living   3 3 3       0 2     Review of Systems  Constitutional: Negative for fever, chills and diaphoresis.  HENT: Negative for ear pain, sinus pressure and sore throat.   Eyes: Negative for visual disturbance.  Respiratory: Negative for cough, shortness of breath and wheezing.   Cardiovascular: Negative for chest pain, palpitations and leg swelling.  Gastrointestinal: Negative for nausea, vomiting, abdominal pain, diarrhea, constipation and blood in stool.  Endocrine: Negative for polyuria.  Genitourinary: Positive for vaginal bleeding and pelvic pain. Negative for dysuria, urgency, frequency, flank pain, decreased urine volume, vaginal discharge, difficulty urinating and vaginal pain.  Musculoskeletal: Negative for back pain and gait problem.  Skin: Negative for rash.  Neurological: Negative for dizziness, syncope, weakness, numbness and headaches.  Hematological: Does not bruise/bleed easily.  Psychiatric/Behavioral: Negative for confusion  and agitation.  All other systems reviewed and are negative.     Allergies  Review of patient's allergies indicates no known allergies.  Home Medications   Prior to Admission medications   Medication Sig Start Date End Date Taking? Authorizing Provider  oxyCODONE-acetaminophen (PERCOCET/ROXICET) 5-325 MG per tablet Take 1 tablet by mouth every 4 (four) hours as needed (for pain scale less than 7). 04/06/14   Olga Millers, MD  Prenatal Vit-Fe Fumarate-FA (PRENATAL MULTIVITAMIN) TABS tablet Take 1 tablet by mouth every other day.    Historical Provider, MD   BP 122/77 mmHg  Pulse 87  Temp(Src) 98.2 F (36.8 C) (Oral)  Resp 18  Ht 5\' 5"  (1.651 m)  Wt 197 lb 1.6 oz (89.404 kg)  BMI 32.80 kg/m2  SpO2 98%  LMP 10/09/2014 Physical Exam  Constitutional: She is oriented to person, place, and time. She appears well-nourished. No distress.  HENT:  Head: Normocephalic and atraumatic.  Right Ear: External ear normal.  Left Ear: External ear normal.  Eyes: Conjunctivae are normal. Pupils are equal, round, and reactive to light.  Neck: Normal range of motion. Neck supple.  Cardiovascular: Normal rate, regular rhythm, normal heart sounds and intact distal pulses.  Exam reveals no gallop and no friction rub.   No murmur heard. Pulmonary/Chest: Effort normal and breath sounds normal. She has no wheezes. She has no rales.  Abdominal: Soft. Bowel sounds are normal. She exhibits no distension. There is no tenderness.  Genitourinary: No vaginal discharge found.  Pelvic exam shows moderate bleeding from the cervical os, no clots visualized, no discharge noted, no lesions, no cervical motion tenderness or adnexal fullness.  Musculoskeletal: Normal range of motion. She exhibits no edema or tenderness.  Neurological: She is alert and oriented to person, place, and time. She has normal reflexes. No cranial nerve deficit. Coordination normal.  Skin: Skin is warm. No rash noted. She is not  diaphoretic. No erythema.  Psychiatric: She has a normal mood and affect. Her behavior is normal.  Nursing note and vitals reviewed.   ED Course  Procedures (including critical care time) Labs Review Labs Reviewed  COMPREHENSIVE METABOLIC PANEL - Abnormal; Notable for the following:    Calcium 8.3 (*)    Albumin 3.4 (*)    ALT 13 (*)    All other components within normal limits  CBC - Abnormal; Notable for the following:    RBC 3.04 (*)    Hemoglobin 8.8 (*)    HCT 26.5 (*)    All other components within normal limits  LIPASE, BLOOD  URINALYSIS, ROUTINE W REFLEX MICROSCOPIC (NOT AT [facility])  I-STAT BETA HCG BLOOD, ED ([facility], [facility], [facility] ONLY)    Imaging Review No results found.   EKG Interpretation None      MDM   Final diagnoses:  None    ngela Shambley is a 34yo [id number] with PMH of depression and three C-sections who presents with abnormal uterine bleeding for the past 1 month and pelvic pain since her last C-section 7 months ago. She has a history of ASCUS with documented HR-HPV positivity in 2014  with no follow-up colposcopy. She is afebrile with normal vitals here, physical exam shows mild lower abdominal tenderness. Pelvic exam shows moderate bleeding from the cervical os, no clots visualized, no discharge noted, no lesions, no cervical motion tenderness or adnexal fullness. Labs show negative pregnancy, WBCs normal, H and H are stable compared to 03/2014. Will refer to gynecology for further work up, as patient is stable for discharge here.    Norval Gable, MD 11/08/14 1424  Elnora Morrison, MD 11/08/14 1556

## 2014-11-08 NOTE — Discharge Instructions (Signed)
°  Abnormal Uterine Bleeding Abnormal uterine bleeding means bleeding from the vagina that is not your normal menstrual period. This can be:  Bleeding or spotting between periods.  Bleeding after sex (sexual intercourse).  Bleeding that is heavier or more than normal.  Periods that last longer than usual.  Bleeding after menopause. There are many problems that may cause this. Treatment will depend on the cause of the bleeding. Any kind of bleeding that is not normal should be reviewed by your doctor.  HOME CARE Watch your condition for any changes. These actions may lessen any discomfort you are having:  Do not use tampons or douches as told by your doctor.  Change your pads often. You should get regular pelvic exams and Pap tests. Keep all appointments for tests as told by your doctor. GET HELP IF:  You are bleeding for more than 1 week.  You feel dizzy at times. GET HELP RIGHT AWAY IF:   You pass out.  You have to change pads every 15 to 30 minutes.  You have belly pain.  You have a fever.  You become sweaty or weak.  You are passing large blood clots from the vagina.  You feel sick to your stomach (nauseous) and throw up (vomit). MAKE SURE YOU:  Understand these instructions.  Will watch your condition.  Will get help right away if you are not doing well or get worse. Document Released: 01/26/2009 Document Revised: 04/05/2013 Document Reviewed: 10/28/2012 [facility] Patient Information 2015 [place], [place]. This information is not intended to replace advice given to you by your health care provider. Make sure you discuss any questions you have with your health care provider.

## 2014-11-08 NOTE — ED Notes (Signed)
Saline Lock IV order accidentally clicked off by tech.

## 2014-11-08 NOTE — ED Notes (Signed)
Pt verbalizes understanding of discharge instructions. Ambulatory with steady gait on departure. A/o x4. VSS.

## 2014-11-08 NOTE — ED Notes (Signed)
Pt reports having tubal done 7 months ago, having lower abd discomfort since. Having abnormal vaginal bleeding x 1 month. No acute distress noted at triage.

## 2015-05-10 ENCOUNTER — Encounter (HOSPITAL_COMMUNITY): Payer: Self-pay

## 2015-05-10 ENCOUNTER — Emergency Department (INDEPENDENT_AMBULATORY_CARE_PROVIDER_SITE_OTHER)
Admission: EM | Admit: 2015-05-10 | Discharge: 2015-05-10 | Disposition: A | Discharge status: Home / Self Care | Payer: Self-pay | Source: Home / Self Care | Attending: Family Medicine | Admitting: Family Medicine

## 2015-05-10 DIAGNOSIS — D649 Anemia, unspecified: Secondary | ICD-10-CM

## 2015-05-10 DIAGNOSIS — N938 Other specified abnormal uterine and vaginal bleeding: Secondary | ICD-10-CM

## 2015-05-10 LAB — POCT I-STAT, CHEM 8
BUN: 8 mg/dL (ref 6–20)
CHLORIDE: 103 mmol/L (ref 101–111)
CREATININE: 0.9 mg/dL (ref 0.44–1.00)
Calcium, Ion: 1.23 mmol/L (ref 1.12–1.23)
Glucose, Bld: 88 mg/dL (ref 65–99)
HEMATOCRIT: 21 % — AB (ref 36.0–46.0)
Hemoglobin: 7.1 g/dL — ABNORMAL LOW (ref 12.0–15.0)
POTASSIUM: 3.7 mmol/L (ref 3.5–5.1)
SODIUM: 141 mmol/L (ref 135–145)
TCO2: 25 mmol/L (ref 0–100)

## 2015-05-10 LAB — POCT PREGNANCY, URINE: Preg Test, Ur: NEGATIVE

## 2015-05-10 MED ORDER — MEGESTROL ACETATE 40 MG PO TABS: 40.0000 mg | ORAL_TABLET | Freq: Two times a day (BID) | ORAL | Status: DC

## 2015-05-10 NOTE — Discharge Instructions (Signed)

## 2015-05-10 NOTE — ED Provider Notes (Signed)
CSN: [id number]     Arrival date & time 05/10/15  1617 History   First MD Initiated Contact with Patient 05/10/15 1826     Chief Complaint  Patient presents with  . Dysmenorrhea   (Consider location/radiation/quality/duration/timing/severity/associated sxs/prior Treatment) HPI Patient is a 35 year old female who states that she has had vaginal bleeding persistently since November. She states that she originally thought it was going to stop but now is starting to feel lightheaded dizzy on occasion and is concerned that she is bleeding too much. She states that she has an apartment with her gynecologist in about 2 weeks. Patient states that she works at [employer] in [employer]. She has had previous symptoms of this nature in the past which have been related to hormone imbalance. Patient states that she does not believe she is pregnant as she had a tubal ligation several years ago. Past Medical History  Diagnosis Date  . Depression   . Anxiety   . H/O chlamydia infection   . GERD (gastroesophageal reflux disease)     with pregnancy   Past Surgical History  Procedure Laterality Date  . Cesarean section      x2  . Dental surgery  2010    6 teeth surgically removed  . Bartholin cyst marsupialization    . Cesarean section with bilateral tubal ligation N/A 04/03/2014    Procedure: CESAREAN SECTION WITH BILATERAL TUBAL LIGATION;  Surgeon: Jerelyn Charles, MD;  Location: [facility];  Service: Obstetrics;  Laterality: N/A;   Family History  Problem Relation Age of Onset  . Diabetes Mother     Type 2  . Hypertension Mother   . Depression Father   . Diabetes Maternal Aunt   . Hypertension Maternal Aunt   . Hypertension Maternal Aunt    Social History  Substance Use Topics  . Smoking status: Never Smoker   . Smokeless tobacco: None  . Alcohol Use: Yes     Comment: occassional before pregnancy   OB History    Gravida Para Term Preterm AB TAB SAB Ectopic Multiple Living   3 3 3       0 2      Review of Systems Positive for persistent vaginal bleeding negative for loss of consciousness, tachycardia Allergies  Review of patient's allergies indicates no known allergies.  Home Medications   Prior to Admission medications   Medication Sig Start Date End Date Taking? Authorizing Provider  oxyCODONE-acetaminophen (PERCOCET/ROXICET) 5-325 MG per tablet Take 1 tablet by mouth every 4 (four) hours as needed (for pain scale less than 7). 04/06/14   Olga Millers, MD  Prenatal Vit-Fe Fumarate-FA (PRENATAL MULTIVITAMIN) TABS tablet Take 1 tablet by mouth every other day.    Historical Provider, MD   Meds Ordered and Administered this Visit  Medications - No data to display  BP 132/52 mmHg  Pulse 80  Temp(Src) 97.9 F (36.6 C) (Oral)  Resp 16  SpO2 100%  LMP 05/10/2015 No data found.   Physical Exam  Constitutional: She appears well-developed and well-nourished.  HENT:  Head: Normocephalic and atraumatic.  Pulmonary/Chest: Effort normal and breath sounds normal.  Abdominal: Soft.  Genitourinary:  States she is bleeding declines pelvic examination  Musculoskeletal: Normal range of motion.  Skin: Skin is warm and dry. There is pallor.  Psychiatric: She has a normal mood and affect. Her behavior is normal. Judgment and thought content normal.    ED Course  Procedures (including critical care time)  Labs Review Labs Reviewed  POCT I-STAT, CHEM 8 - Abnormal; Notable for the following:    Hemoglobin 7.1 (*)    HCT 21.0 (*)    All other components within normal limits  POCT PREGNANCY, URINE    Imaging Review No results found.   Visual Acuity Review  Right Eye Distance:   Left Eye Distance:   Bilateral Distance:    Right Eye Near:   Left Eye Near:    Bilateral Near:         MDM   1. Dysfunctional uterine bleeding   2. Anemia, unspecified anemia type    Discussed with Dr. Maylene Roes, suggest Megace 40 mg bid, follow up in office next week.  Return to  work note provided.     Konrad Felix, PA 05/10/15 [place], [place] 05/10/15 [phone]

## 2015-05-10 NOTE — ED Notes (Signed)
Pt stated that she has had abnormal menstrual bleeding for 2 months Pt stated that she been feeling weak Pt alert and oriented

## 2015-06-06 ENCOUNTER — Other Ambulatory Visit: Payer: Self-pay | Admitting: Obstetrics and Gynecology

## 2015-06-06 ENCOUNTER — Inpatient Hospital Stay (HOSPITAL_COMMUNITY): Payer: Medicaid Other

## 2015-06-06 ENCOUNTER — Encounter (HOSPITAL_COMMUNITY): Payer: Self-pay

## 2015-06-06 ENCOUNTER — Observation Stay (HOSPITAL_COMMUNITY)
Admission: EM | Admit: 2015-06-06 | Discharge: 2015-06-07 | Disposition: A | Discharge status: Home / Self Care | Payer: Medicaid Other | Attending: Obstetrics and Gynecology | Admitting: Obstetrics and Gynecology

## 2015-06-06 DIAGNOSIS — R55 Syncope and collapse: Secondary | ICD-10-CM | POA: Diagnosis not present

## 2015-06-06 DIAGNOSIS — N920 Excessive and frequent menstruation with regular cycle: Secondary | ICD-10-CM

## 2015-06-06 DIAGNOSIS — N939 Abnormal uterine and vaginal bleeding, unspecified: Secondary | ICD-10-CM

## 2015-06-06 DIAGNOSIS — D649 Anemia, unspecified: Principal | ICD-10-CM | POA: Diagnosis present

## 2015-06-06 DIAGNOSIS — D500 Iron deficiency anemia secondary to blood loss (chronic): Secondary | ICD-10-CM

## 2015-06-06 DIAGNOSIS — R404 Transient alteration of awareness: Secondary | ICD-10-CM | POA: Diagnosis not present

## 2015-06-06 HISTORY — DX: Anemia, unspecified: D64.9

## 2015-06-06 LAB — COMPREHENSIVE METABOLIC PANEL
ALBUMIN: 3.4 g/dL — AB (ref 3.5–5.0)
ALT: 9 U/L — AB (ref 14–54)
AST: 16 U/L (ref 15–41)
Alkaline Phosphatase: 53 U/L (ref 38–126)
Anion gap: 8 (ref 5–15)
BUN: 7 mg/dL (ref 6–20)
CHLORIDE: 109 mmol/L (ref 101–111)
CO2: 22 mmol/L (ref 22–32)
CREATININE: 0.79 mg/dL (ref 0.44–1.00)
Calcium: 8.9 mg/dL (ref 8.9–10.3)
GFR calc non Af Amer: >60 mL/min (ref >60)
Glucose, Bld: 95 mg/dL (ref 65–99)
Potassium: 4.3 mmol/L (ref 3.5–5.1)
SODIUM: 139 mmol/L (ref 135–145)
Total Bilirubin: 0.3 mg/dL (ref 0.3–1.2)
Total Protein: 6.2 g/dL — ABNORMAL LOW (ref 6.5–8.1)

## 2015-06-06 LAB — CBC WITH DIFFERENTIAL/PLATELET
BASOS ABS: 0 10*3/uL (ref 0.0–0.1)
BASOS PCT: 0 %
EOS ABS: 0.1 10*3/uL (ref 0.0–0.7)
Eosinophils Relative: 1 %
HCT: 17.9 % — ABNORMAL LOW (ref 36.0–46.0)
Hemoglobin: 4.9 g/dL — CL (ref 12.0–15.0)
LYMPHS PCT: 23 %
Lymphs Abs: 1.4 10*3/uL (ref 0.7–4.0)
MCH: 19.8 pg — AB (ref 26.0–34.0)
MCHC: 27.4 g/dL — ABNORMAL LOW (ref 30.0–36.0)
MCV: 72.5 fL — ABNORMAL LOW (ref 78.0–100.0)
MONO ABS: 0.3 10*3/uL (ref 0.1–1.0)
Monocytes Relative: 5 %
Neutro Abs: 4.4 10*3/uL (ref 1.7–7.7)
Neutrophils Relative %: 71 %
PLATELETS: 348 10*3/uL (ref 150–400)
RBC: 2.47 MIL/uL — AB (ref 3.87–5.11)
RDW: 16.5 % — ABNORMAL HIGH (ref 11.5–15.5)
WBC: 6.2 10*3/uL (ref 4.0–10.5)

## 2015-06-06 LAB — PREPARE RBC (CROSSMATCH)

## 2015-06-06 LAB — PROTIME-INR
INR: 1.22 (ref 0.00–1.49)
Prothrombin Time: 15.6 seconds — ABNORMAL HIGH (ref 11.6–15.2)

## 2015-06-06 LAB — APTT: APTT: 29 s (ref 24–37)

## 2015-06-06 LAB — ABO/RH: ABO/RH(D): O POS

## 2015-06-06 MED ORDER — ACETAMINOPHEN 325 MG PO TABS: 650.0000 mg | ORAL_TABLET | Freq: Once | ORAL | Status: AC

## 2015-06-06 MED ORDER — PRENATAL MULTIVITAMIN CH: 1.0000 | ORAL_TABLET | Freq: Every day | ORAL | Status: DC

## 2015-06-06 MED ORDER — DIPHENHYDRAMINE HCL 25 MG PO CAPS: 25.0000 mg | ORAL_CAPSULE | Freq: Once | ORAL | Status: AC

## 2015-06-06 MED ORDER — SODIUM CHLORIDE 0.9 % IV BOLUS (SEPSIS)
1000.0000 mL | Freq: Once | INTRAVENOUS | Status: AC
  Administered 2015-06-06: 1000 mL via INTRAVENOUS

## 2015-06-06 MED ORDER — ONDANSETRON HCL 4 MG/2ML IJ SOLN: 4.0000 mg | Freq: Three times a day (TID) | INTRAMUSCULAR | Status: AC | PRN

## 2015-06-06 MED ORDER — SODIUM CHLORIDE 0.9 % IV SOLN
Freq: Once | INTRAVENOUS | Status: AC
  Administered 2015-06-06: 100 mL via INTRAVENOUS

## 2015-06-06 MED ORDER — SODIUM CHLORIDE 0.9 % IV SOLN: Freq: Once | INTRAVENOUS | Status: AC

## 2015-06-06 MED ORDER — SODIUM CHLORIDE 0.9 % IV BOLUS (SEPSIS)
1000.0000 mL | Freq: Once | INTRAVENOUS | Status: AC
Start: 2015-06-06 — End: 2015-06-06
  Administered 2015-06-06: 1000 mL via INTRAVENOUS

## 2015-06-06 NOTE — ED Provider Notes (Addendum)
CSN: [id number]     Arrival date & time 06/06/15  [id number] History   First MD Initiated Contact with Patient 06/06/15 [phone]     Chief Complaint  Patient presents with  . Loss of Consciousness     (Consider location/radiation/quality/duration/timing/severity/associated sxs/prior Treatment) HPI Comments: The patient is a 35 year old female, she has a history of cesarean section and bilateral tubal ligation which occurred approximately 15 or 16 months ago, she has been placed on Megace in the past because of vaginal bleeding, she also reports that over the last couple of months she has had chronic daily heavy vaginal bleeding and over the last couple of weeks has had increased dyspnea on exertion, increased positional lightheadedness and now this morning had a syncopal episode at work when she became lightheaded, sat down and lost consciousness. She reports that she has to use the bladder control pounds because the normal pads are not absorbent enough for the volume of blood that she is losing. Symptoms are now severe, constant, worse with standing  The patient was started on Megace one month ago at the urgent care, she was set up with an outpatient follow-up but did not follow-up.  Patient is a 35 y.o. female presenting with syncope. The history is provided by the patient.  Loss of Consciousness   Past Medical History  Diagnosis Date  . Depression   . Anxiety   . H/O chlamydia infection   . GERD (gastroesophageal reflux disease)     with pregnancy  . Severe anemia 06/06/2015   Past Surgical History  Procedure Laterality Date  . Cesarean section      x2  . Dental surgery  2010    6 teeth surgically removed  . Bartholin cyst marsupialization    . Cesarean section with bilateral tubal ligation N/A 04/03/2014    Procedure: CESAREAN SECTION WITH BILATERAL TUBAL LIGATION;  Surgeon: Jerelyn Charles, MD;  Location: [facility];  Service: Obstetrics;  Laterality: N/A;   Family History  Problem Relation  Age of Onset  . Diabetes Mother     Type 2  . Hypertension Mother   . Depression Father   . Diabetes Maternal Aunt   . Hypertension Maternal Aunt   . Hypertension Maternal Aunt    Social History  Substance Use Topics  . Smoking status: Never Smoker   . Smokeless tobacco: None  . Alcohol Use: Yes     Comment: occassional before pregnancy   OB History    Gravida Para Term Preterm AB TAB SAB Ectopic Multiple Living   3 3 3       0 2     Review of Systems  Cardiovascular: Positive for syncope.  All other systems reviewed and are negative.     Allergies  Review of patient's allergies indicates no known allergies.  Home Medications   Prior to Admission medications   Medication Sig Start Date End Date Taking? Authorizing Provider  megestrol (MEGACE) 40 MG tablet Take 1 tablet (40 mg total) by mouth 2 (two) times daily. Patient not taking: Reported on 06/06/2015 05/10/15   Konrad Felix, PA  oxyCODONE-acetaminophen (PERCOCET/ROXICET) 5-325 MG per tablet Take 1 tablet by mouth every 4 (four) hours as needed (for pain scale less than 7). Patient not taking: Reported on 06/06/2015 04/06/14   Olga Millers, MD  Prenatal Vit-Fe Fumarate-FA (PRENATAL MULTIVITAMIN) TABS tablet Take 1 tablet by mouth every other day.    Historical Provider, MD   BP 106/65 mmHg  Pulse 75  Temp(Src) 97.8 F (36.6 C)  Resp 16  Ht 5\' 6"  (1.676 m)  Wt 200 lb (90.719 kg)  BMI 32.30 kg/m2  SpO2 100%  LMP 05/10/2015 Physical Exam  Constitutional: She appears well-developed and well-nourished. No distress.  HENT:  Head: Normocephalic and atraumatic.  Mouth/Throat: Oropharynx is clear and moist. No oropharyngeal exudate.  Eyes: EOM are normal. Pupils are equal, round, and reactive to light. Right eye exhibits no discharge. Left eye exhibits no discharge. No scleral icterus.  Pale conjunctiva  Neck: Normal range of motion. Neck supple. No JVD present. No thyromegaly present.  Cardiovascular: Normal  rate, regular rhythm, normal heart sounds and intact distal pulses.  Exam reveals no gallop and no friction rub.   No murmur heard. Pulmonary/Chest: Effort normal and breath sounds normal. No respiratory distress. She has no wheezes. She has no rales.  Abdominal: Soft. Bowel sounds are normal. She exhibits no distension and no mass. There is tenderness (mild suprapubic tenderness).  Genitourinary:  Dark vaginal blood, cervix slightly open, no tissue, no masses, no vaginal lacerations - chaperone present  Musculoskeletal: Normal range of motion. She exhibits no edema or tenderness.  Lymphadenopathy:    She has no cervical adenopathy.  Neurological: She is alert. Coordination normal.  Skin: Skin is warm and dry. No rash noted. No erythema.  Psychiatric: She has a normal mood and affect. Her behavior is normal.  Nursing note and vitals reviewed.   ED Course  Procedures (including critical care time) Labs Review Labs Reviewed  CBC WITH DIFFERENTIAL/PLATELET - Abnormal; Notable for the following:    RBC 2.47 (*)    Hemoglobin 4.9 (*)    HCT 17.9 (*)    MCV 72.5 (*)    MCH 19.8 (*)    MCHC 27.4 (*)    RDW 16.5 (*)    All other components within normal limits  COMPREHENSIVE METABOLIC PANEL - Abnormal; Notable for the following:    Total Protein 6.2 (*)    Albumin 3.4 (*)    ALT 9 (*)    All other components within normal limits  PROTIME-INR - Abnormal; Notable for the following:    Prothrombin Time 15.6 (*)    All other components within normal limits  APTT  TYPE AND SCREEN  ABO/RH    Imaging Review No results found. I have personally reviewed and evaluated these images and lab results as part of my medical decision-making.    MDM   Final diagnoses:  Severe anemia  Uterine bleeding    The patient is extremely pale appearing, her vital signs are normal when she is supine, I suspect that she is orthostatic both from her blood loss as well as poor appetite recently. I will  give her a fluid bolus, type and screen, CBC, I anticipate the need for gynecologic intervention as she may need a D&C, she may also have uterine fibroids and require further evaluation. Will discussed with gynecology after labs have resulted. She does appear hemodynamically stable at this moment.  Hgb < 5, will start transfusion - will d/w GYN  Pt in agreement.  Discussed the care with Dr. Elly Modena, she will accept the patient in transfer. Transfusion orders started prior to transfer  Noemi Chapel, MD 06/06/15 1104  Noemi Chapel, MD 06/06/15 [place], MD 06/06/15 [phone]

## 2015-06-06 NOTE — H&P (Signed)
Sherry [name] is an 35 y.o. female G3P3 transferred from [place] for the medical management of symptomatic anemia secondary to menorrhagia. Patient was brought in today following a syncopal episode at work this morning. She reports a long history of abnormal vaginal bleeding. She describes onset of irregular bleeding following her 2015 c-section. She states that she would alternate between a full month of heavy bleeding with passage of clots followed by a month of amenorrhea. She reports feeling weak and fatigued over the past few days forcing her to take a day off work the day prior to admission. She denies chest pain or SOB.  Pertinent Gynecological History: Bleeding: dysfunctional uterine bleeding Contraception: tubal ligation DES exposure: denies Blood transfusions: none Previous GYN Procedures: none  Last mammogram: n/a Last pap: normal Date: 09/06/2013 OB History: G3, P3   Menstrual History:  Patient's last menstrual period was 05/10/2015.    Past Medical History  Diagnosis Date  . Depression   . Anxiety   . H/O chlamydia infection   . GERD (gastroesophageal reflux disease)     with pregnancy  . Severe anemia 06/06/2015    Past Surgical History  Procedure Laterality Date  . Cesarean section      x2  . Dental surgery  2010    6 teeth surgically removed  . Bartholin cyst marsupialization    . Cesarean section with bilateral tubal ligation N/A 04/03/2014    Procedure: CESAREAN SECTION WITH BILATERAL TUBAL LIGATION;  Surgeon: Jerelyn Charles, MD;  Location: [facility];  Service: Obstetrics;  Laterality: N/A;    Family History  Problem Relation Age of Onset  . Diabetes Mother     Type 2  . Hypertension Mother   . Depression Father   . Diabetes Maternal Aunt   . Hypertension Maternal Aunt   . Hypertension Maternal Aunt     Social History:  reports that she has never smoked. She does not have any smokeless tobacco history on file. She reports that she drinks alcohol. She  reports that she does not use illicit drugs.  Allergies: No Known Allergies  Prescriptions prior to admission  Medication Sig Dispense Refill Last Dose  . megestrol (MEGACE) 40 MG tablet Take 1 tablet (40 mg total) by mouth 2 (two) times daily. (Patient not taking: Reported on 06/06/2015) 14 tablet 0   . oxyCODONE-acetaminophen (PERCOCET/ROXICET) 5-325 MG per tablet Take 1 tablet by mouth every 4 (four) hours as needed (for pain scale less than 7). (Patient not taking: Reported on 06/06/2015) 30 tablet 0 Unknown at Unknown time  . Prenatal Vit-Fe Fumarate-FA (PRENATAL MULTIVITAMIN) TABS tablet Take 1 tablet by mouth every other day.   Unknown at Unknown time    Review of Systems  All other systems reviewed and are negative.   Blood pressure 123/67, pulse 82, temperature 98.5 F (36.9 C), temperature source Oral, resp. rate 18, height 5\' 6"  (1.676 m), weight 200 lb (90.719 kg), last menstrual period 05/10/2015, SpO2 100 %, unknown if currently breastfeeding. Physical Exam GENERAL: Well-developed, well-nourished female in no acute distress.  HEENT: Normocephalic, atraumatic. Sclerae anicteric.  NECK: Supple. Normal thyroid.  LUNGS: Clear to auscultation bilaterally.  HEART: Regular rate and rhythm. ABDOMEN: Soft, nontender, nondistended.  PELVIC: Normal external female genitalia. Uterus is normal in size. No adnexal mass or tenderness. Scant blood in vaginal vault EXTREMITIES: No cyanosis, clubbing, or edema, 2+ distal pulses.  Results for orders placed or performed during the hospital encounter of 06/06/15 (from the past 24 hour(s))  Type and screen     Status: None (Preliminary result)   Collection Time: 06/06/15  9:37 AM  Result Value Ref Range   ABO/RH(D) O POS    Antibody Screen NEG    Sample Expiration 06/09/2015    Unit Number [id number]    Blood Component Type RBC LR PHER2    Unit division 00    Status of Unit ISSUED    Transfusion Status OK TO TRANSFUSE    Crossmatch  Result Compatible    Unit Number [id number]    Blood Component Type RBC LR PHER2    Unit division 00    Status of Unit ISSUED    Transfusion Status OK TO TRANSFUSE    Crossmatch Result Compatible   ABO/Rh     Status: None   Collection Time: 06/06/15  9:37 AM  Result Value Ref Range   ABO/RH(D) O POS   CBC with Differential/Platelet     Status: Abnormal   Collection Time: 06/06/15  9:51 AM  Result Value Ref Range   WBC 6.2 4.0 - 10.5 K/uL   RBC 2.47 (L) 3.87 - 5.11 MIL/uL   Hemoglobin 4.9 (LL) 12.0 - 15.0 g/dL   HCT 17.9 (L) 36.0 - 46.0 %   MCV 72.5 (L) 78.0 - 100.0 fL   MCH 19.8 (L) 26.0 - 34.0 pg   MCHC 27.4 (L) 30.0 - 36.0 g/dL   RDW 16.5 (H) 11.5 - 15.5 %   Platelets 348 150 - 400 K/uL   Neutrophils Relative % 71 %   Lymphocytes Relative 23 %   Monocytes Relative 5 %   Eosinophils Relative 1 %   Basophils Relative 0 %   Neutro Abs 4.4 1.7 - 7.7 K/uL   Lymphs Abs 1.4 0.7 - 4.0 K/uL   Monocytes Absolute 0.3 0.1 - 1.0 K/uL   Eosinophils Absolute 0.1 0.0 - 0.7 K/uL   Basophils Absolute 0.0 0.0 - 0.1 K/uL   RBC Morphology ELLIPTOCYTES   Comprehensive metabolic panel     Status: Abnormal   Collection Time: 06/06/15  9:51 AM  Result Value Ref Range   Sodium 139 135 - 145 mmol/L   Potassium 4.3 3.5 - 5.1 mmol/L   Chloride 109 101 - 111 mmol/L   CO2 22 22 - 32 mmol/L   Glucose, Bld 95 65 - 99 mg/dL   BUN 7 6 - 20 mg/dL   Creatinine, Ser 0.79 0.44 - 1.00 mg/dL   Calcium 8.9 8.9 - 10.3 mg/dL   Total Protein 6.2 (L) 6.5 - 8.1 g/dL   Albumin 3.4 (L) 3.5 - 5.0 g/dL   AST 16 15 - 41 U/L   ALT 9 (L) 14 - 54 U/L   Alkaline Phosphatase 53 38 - 126 U/L   Total Bilirubin 0.3 0.3 - 1.2 mg/dL   GFR calc non Af Amer >60 >60 mL/min   GFR calc Af Amer >60 >60 mL/min   Anion gap 8 5 - 15  Protime-INR     Status: Abnormal   Collection Time: 06/06/15  9:51 AM  Result Value Ref Range   Prothrombin Time 15.6 (H) 11.6 - 15.2 seconds   INR 1.22 0.00 - 1.49  APTT     Status: None    Collection Time: 06/06/15  9:51 AM  Result Value Ref Range   aPTT 29 24 - 37 seconds  Prepare RBC     Status: None   Collection Time: 06/06/15 11:07 AM  Result Value Ref Range   Order  Confirmation ORDER PROCESSED BY BLOOD BANK     No results found.  Assessment/Plan: 35 yo with symptomatic anemia secondary to DUB - Admit for blood transfusion for a total of 4 units pRBC - Patient has a follow up appointment in GYN office on 2/27 - Pelvic ultrasound ordered - Megace upon discharge if bleeding increases or for prophylaxis  Jedd Schulenburg 06/06/2015, 4:53 PM

## 2015-06-06 NOTE — ED Notes (Signed)
Pt up to bop to void

## 2015-06-06 NOTE — ED Notes (Signed)
Pt arrives EMS after passing out at work while talking with supervisor. Pt states hx of very heavy flow period constant x 2 months . C/o abdominal cramping.

## 2015-06-07 LAB — CBC
HEMATOCRIT: 30.7 % — AB (ref 36.0–46.0)
HEMOGLOBIN: 9.6 g/dL — AB (ref 12.0–15.0)
MCH: 24.5 pg — ABNORMAL LOW (ref 26.0–34.0)
MCHC: 31.3 g/dL (ref 30.0–36.0)
MCV: 78.3 fL (ref 78.0–100.0)
Platelets: 357 10*3/uL (ref 150–400)
RBC: 3.92 MIL/uL (ref 3.87–5.11)
RDW: 17.6 % — AB (ref 11.5–15.5)
WBC: 7.1 10*3/uL (ref 4.0–10.5)

## 2015-06-07 LAB — TYPE AND SCREEN
ABO/RH(D): O POS
Antibody Screen: NEGATIVE
Unit division: 0
Unit division: 0

## 2015-06-07 MED ORDER — MEGESTROL ACETATE 40 MG PO TABS: 40.0000 mg | ORAL_TABLET | Freq: Every day | ORAL | Status: DC

## 2015-06-07 MED ORDER — INFLUENZA VAC SPLIT QUAD 0.5 ML IM SUSY: 0.5000 mL | PREFILLED_SYRINGE | INTRAMUSCULAR | Status: DC

## 2015-06-07 NOTE — Care Management Note (Signed)
Case Management Note  Patient Details  Name: Sherry [name] MRN: [id number] Date of Birth: 1980-12-14  Subjective/Objective:      36 year old female admitted 06/06/15 with severe anemia.              Action/Plan:D/C when medically stable.   Additional Comments:CM received pc from [name] regarding CM consult.  CM referral is more appropriate for financial counselor.  Pt admitted as self pay.  [name] given Development worker, community phone numbers.  CM consult was placed to help pt obtain insurance which financial counseling sees and evaluates patients for.[name] RNC-MNN, BSN. 06/07/2015, 2:54 PM

## 2015-06-07 NOTE — Progress Notes (Signed)
Last blood transfusion completed at 0440am. Patient tolerated it. No reaction or signs of distress observed. Patient made comfortable in bed. Will keep monitoring.

## 2015-06-07 NOTE — Discharge Instructions (Signed)
Please take the Megace (40 mg daily) until you are able to schedule an ablation procedure. The prescription for this medicine has been sent to your pharmacy.  Endometrial Ablation Endometrial ablation removes the lining of the uterus (endometrium). It is usually a same-day, outpatient treatment. Ablation helps avoid major surgery, such as surgery to remove the cervix and uterus (hysterectomy). After endometrial ablation, you will have little or no menstrual bleeding and may not be able to have children. However, if you are premenopausal, you will need to use a reliable method of birth control following the procedure because of the small chance that pregnancy can occur. There are different reasons to have this procedure. These reasons include:  Heavy periods.  Bleeding that is causing anemia.  Irregular bleeding.  Bleeding fibroids on the lining inside the uterus if they are smaller than 3 centimeters. This procedure may not be possible for you if:   You want to have children in the future.   You have severe cramps with your menstrual period.   You have precancerous or cancerous cells in your uterus.   You were recently pregnant.   You have gone through menopause.   You have had major surgery on your uterus, resulting in thinning of the uterine wall. Surgeries may include:  The removal of one or more uterine fibroids (myomectomy).  A cesarean section with a classic (vertical) incision on your uterus. Ask your health care provider what type of cesarean you had. Sometimes the scar on your skin is different than the scar on your uterus. Even if you have had surgery on your uterus, certain types of ablation may still be safe for you. Talk with your health care provider. LET [facility] CARE PROVIDER KNOW ABOUT:  Any allergies you have.  All medicines you are taking, including vitamins, herbs, eye drops, creams, and over-the-counter medicines.  Previous problems you or members of  your family have had with the use of anesthetics.  Any blood disorders you have.  Previous surgeries you have had.  Medical conditions you have. RISKS AND COMPLICATIONS  Generally, this is a safe procedure. However, as with any procedure, complications can occur. Possible complications include:  Perforation of the uterus.  Bleeding.  Infection of the uterus, bladder, or vagina.  Injury to surrounding organs.  An air bubble to the lung (air embolus).  Pregnancy following the procedure.  Failure of the procedure to help the problem, requiring hysterectomy.  Decreased ability to diagnose cancer in the lining of the uterus. BEFORE THE PROCEDURE  The lining of the uterus must be tested to make sure there is no pre-cancerous or cancer cells present.  An ultrasound may be performed to look at the size of the uterus and to check for abnormalities.  Medicines may be given to thin the lining of the uterus. PROCEDURE  During the procedure, your health care provider will use a tool called a resectoscope to help see inside your uterus. There are different ways to remove the lining of your uterus.   Radiofrequency - This method uses a radiofrequency-alternating electric current to remove the lining of the uterus.  Cryotherapy - This method uses extreme cold to freeze the lining of the uterus.  Heated-Free Liquid - This method uses heated salt (saline) solution to remove the lining of the uterus.  Microwave - This method uses high-energy microwaves to heat up the lining of the uterus to remove it.  Thermal balloon - This method involves inserting a catheter with a  balloon tip into the uterus. The balloon tip is filled with heated fluid to remove the lining of the uterus. AFTER THE PROCEDURE  After your procedure, do not have sexual intercourse or insert anything into your vagina until permitted by your health care provider. After the procedure, you may experience:  Cramps.  Vaginal  discharge.  Frequent urination.   This information is not intended to replace advice given to you by your health care provider. Make sure you discuss any questions you have with your health care provider.   Document Released: 02/08/2004 Document Revised: 12/20/2014 Document Reviewed: 09/01/2012 Elsevier Interactive Patient Education 2016 Elsevier Inc[employer] Dysfunctional Uterine Bleeding Dysfunctional uterine bleeding is abnormal bleeding from the uterus. Dysfunctional uterine bleeding includes:  A period that comes earlier or later than usual.  A period that is lighter, heavier, or has blood clots.  Bleeding between periods.  Skipping one or more periods.  Bleeding after sexual intercourse.  Bleeding after menopause. HOME CARE INSTRUCTIONS  Pay attention to any changes in your symptoms. Follow these instructions to help with your condition: Eating  Eat well-balanced meals. Include foods that are high in iron, such as liver, meat, shellfish, green leafy vegetables, and eggs.  If you become constipated:  Drink plenty of water.  Eat fruits and vegetables that are high in water and fiber, such as spinach, carrots, raspberries, apples, and mango. Medicines  Take over-the-counter and prescription medicines only as told by your health care provider.  Do not change medicines without talking with your health care provider.  Aspirin or medicines that contain aspirin may make the bleeding worse. Do not take those medicines:  During the week before your period.  During your period.  If you were prescribed iron pills, take them as told by your health care provider. Iron pills help to replace iron that your body loses because of this condition. Activity  If you need to change your sanitary pad or tampon more than one time every 2 hours:  Lie in bed with your feet raised (elevated).  Place a cold pack on your lower abdomen.  Rest as much as possible until the bleeding stops or  slows down.  Do not try to lose weight until the bleeding has stopped and your blood iron level is back to normal. Other Instructions  For two months, write down:  When your period starts.  When your period ends.  When any abnormal bleeding occurs.  What problems you notice.  Keep all follow up visits as told by your health care provider. This is important. SEEK MEDICAL CARE IF:  You get light-headed or weak.  You have nausea and vomiting.  You cannot eat or drink without vomiting.  You feel dizzy or have diarrhea while you are taking medicines.  You are taking birth control pills or hormones, and you want to change them or stop taking them. SEEK IMMEDIATE MEDICAL CARE IF:  You develop a fever or chills.  You need to change your sanitary pad or tampon more than one time per hour.  Your bleeding becomes heavier, or your flow contains clots more often.  You develop pain in your abdomen.  You lose consciousness.  You develop a rash.   This information is not intended to replace advice given to you by your health care provider. Make sure you discuss any questions you have with your health care provider.   Document Released: 03/28/2000 Document Revised: 12/20/2014 Document Reviewed: 06/26/2014 Elsevier Interactive Patient Education [employer].

## 2015-06-07 NOTE — Progress Notes (Signed)
Subjective: Patient reports minimal bleeding.    Objective: I have reviewed patient's vital signs, intake and output, medications, labs and radiology results.  General: alert, cooperative and no distress GI: soft, non-tender; bowel sounds normal; no masses,  no organomegaly   S/p 4 units PRBC, awaiting post transfusion H/H  US Transvaginal Non-ob  06/06/2015  CLINICAL DATA:  Dysfunctional uterine bleeding. EXAM: TRANSABDOMINAL AND TRANSVAGINAL ULTRASOUND OF PELVIS TECHNIQUE: Both transabdominal and transvaginal ultrasound examinations of the pelvis were performed. Transabdominal technique was performed for global imaging of the pelvis including uterus, ovaries, adnexal regions, and pelvic cul-de-sac. It was necessary to proceed with endovaginal exam following the transabdominal exam to visualize the uterus and ovaries. COMPARISON:  None FINDINGS: Uterus Measurements: 11.5 x 5.6 x 5.9 cm. Retroverted without evidence for fibroids. Myometrial echotexture is slightly heterogeneous. Endometrium Thickness: 7 mm. Difficult to visualize given uterine position and anatomy. Right ovary Measurements: 4.6 x 1.9 x 1.6 cm. Normal appearance/no adnexal mass. Left ovary Measurements: 4.1 x 2.9 x 3.0 cm. 2.9 cm simple cyst in the ovary. Other findings No abnormal free fluid. IMPRESSION: 1. Slight heterogeneity of the myometrial echotexture. Imaging features raise the question of adenomyosis. 2. Otherwise unremarkable study. Electronically Signed   By: Misty Stanley M.D.   On: 06/06/2015 21:07   US Pelvis Complete  06/06/2015  CLINICAL DATA:  Dysfunctional uterine bleeding. EXAM: TRANSABDOMINAL AND TRANSVAGINAL ULTRASOUND OF PELVIS TECHNIQUE: Both transabdominal and transvaginal ultrasound examinations of the pelvis were performed. Transabdominal technique was performed for global imaging of the pelvis including uterus, ovaries, adnexal regions, and pelvic cul-de-sac. It was necessary to proceed with endovaginal exam  following the transabdominal exam to visualize the uterus and ovaries. COMPARISON:  None FINDINGS: Uterus Measurements: 11.5 x 5.6 x 5.9 cm. Retroverted without evidence for fibroids. Myometrial echotexture is slightly heterogeneous. Endometrium Thickness: 7 mm. Difficult to visualize given uterine position and anatomy. Right ovary Measurements: 4.6 x 1.9 x 1.6 cm. Normal appearance/no adnexal mass. Left ovary Measurements: 4.1 x 2.9 x 3.0 cm. 2.9 cm simple cyst in the ovary. Other findings No abnormal free fluid. IMPRESSION: 1. Slight heterogeneity of the myometrial echotexture. Imaging features raise the question of adenomyosis. 2. Otherwise unremarkable study. Electronically Signed   By: Misty Stanley M.D.   On: 06/06/2015 21:07     Assessment/Plan: DUB, normal sonogram Anemia s/p 4 units PRBC  May be able to go home later today Will need to consider definitive therapy, ablation would be appropriate, s/p BTL with last Caesarean section  LOS: 1 day    EURE,LUTHER H 06/07/2015, 7:50 AM

## 2015-06-07 NOTE — Progress Notes (Signed)
Pt. Is discharged in the care of Mother,with N.T. Escort. Denies any pain or discomfort. No vaginal bleeding at present time. [employer] instructions with Rx were given to pt with good understanding Questions asked and  Answered. No distress.

## 2015-06-09 LAB — TYPE AND SCREEN
ABO/RH(D): O POS
Antibody Screen: NEGATIVE
UNIT DIVISION: 0
Unit division: 0
Unit division: 0
Unit division: 0

## 2015-06-11 ENCOUNTER — Encounter: Payer: Self-pay | Admitting: Obstetrics & Gynecology

## 2015-06-11 ENCOUNTER — Ambulatory Visit (INDEPENDENT_AMBULATORY_CARE_PROVIDER_SITE_OTHER): Payer: Self-pay | Admitting: Obstetrics & Gynecology

## 2015-06-11 VITALS — BP 131/80 | HR 64 | Temp 98.2°F | Ht 65.0 in | Wt 185.2 lb

## 2015-06-11 DIAGNOSIS — D62 Acute posthemorrhagic anemia: Secondary | ICD-10-CM

## 2015-06-11 DIAGNOSIS — N939 Abnormal uterine and vaginal bleeding, unspecified: Secondary | ICD-10-CM

## 2015-06-11 LAB — CBC
HCT: 34.2 % — ABNORMAL LOW (ref 36.0–46.0)
HEMOGLOBIN: 10.5 g/dL — AB (ref 12.0–15.0)
MCH: 23.9 pg — ABNORMAL LOW (ref 26.0–34.0)
MCHC: 30.7 g/dL (ref 30.0–36.0)
MCV: 77.7 fL — ABNORMAL LOW (ref 78.0–100.0)
MPV: 10 fL (ref 8.6–12.4)
Platelets: 447 10*3/uL — ABNORMAL HIGH (ref 150–400)
RBC: 4.4 MIL/uL (ref 3.87–5.11)
RDW: 20 % — AB (ref 11.5–15.5)
WBC: 7.1 10*3/uL (ref 4.0–10.5)

## 2015-06-11 MED ORDER — MEGESTROL ACETATE 40 MG PO TABS: 40.0000 mg | ORAL_TABLET | Freq: Two times a day (BID) | ORAL | Status: DC

## 2015-06-11 NOTE — Patient Instructions (Signed)
Endometrial Ablation °Endometrial ablation removes the lining of the uterus (endometrium). It is usually a same-day, outpatient treatment. Ablation helps avoid major surgery, such as surgery to remove the cervix and uterus (hysterectomy). After endometrial ablation, you will have little or no menstrual bleeding and may not be able to have children. However, if you are premenopausal, you will need to use a reliable method of birth control following the procedure because of the small chance that pregnancy can occur. °There are different reasons to have this procedure. These reasons include: °· Heavy periods. °· Bleeding that is causing anemia. °· Irregular bleeding. °· Bleeding fibroids on the lining inside the uterus if they are smaller than 3 centimeters. °This procedure may not be possible for you if:  °· You want to have children in the future.   °· You have severe cramps with your menstrual period.   °· You have precancerous or cancerous cells in your uterus.   °· You were recently pregnant.   °· You have gone through menopause.   °· You have had major surgery on your uterus, resulting in thinning of the uterine wall. Surgeries may include: °¨ The removal of one or more uterine fibroids (myomectomy). °¨ A cesarean section with a classic (vertical) incision on your uterus. Ask your health care provider what type of cesarean you had. Sometimes the scar on your skin is different than the scar on your uterus. °Even if you have had surgery on your uterus, certain types of ablation may still be safe for you. Talk with your health care provider. °LET YOUR HEALTH CARE PROVIDER KNOW ABOUT: °· Any allergies you have. °· All medicines you are taking, including vitamins, herbs, eye drops, creams, and over-the-counter medicines. °· Previous problems you or members of your family have had with the use of anesthetics. °· Any blood disorders you have. °· Previous surgeries you have had. °· Medical conditions you have. °RISKS AND  COMPLICATIONS  °Generally, this is a safe procedure. However, as with any procedure, complications can occur. Possible complications include: °· Perforation of the uterus. °· Bleeding. °· Infection of the uterus, bladder, or vagina. °· Injury to surrounding organs. °· An air bubble to the lung (air embolus). °· Pregnancy following the procedure. °· Failure of the procedure to help the problem, requiring hysterectomy. °· Decreased ability to diagnose cancer in the lining of the uterus. °BEFORE THE PROCEDURE °· The lining of the uterus must be tested to make sure there is no pre-cancerous or cancer cells present. °· An ultrasound may be performed to look at the size of the uterus and to check for abnormalities. °· Medicines may be given to thin the lining of the uterus. °PROCEDURE  °During the procedure, your health care provider will use a tool called a resectoscope to help see inside your uterus. There are different ways to remove the lining of your uterus.  °· Radiofrequency - This method uses a radiofrequency-alternating electric current to remove the lining of the uterus. °· Cryotherapy - This method uses extreme cold to freeze the lining of the uterus. °· Heated-Free Liquid - This method uses heated salt (saline) solution to remove the lining of the uterus. °· Microwave - This method uses high-energy microwaves to heat up the lining of the uterus to remove it. °· Thermal balloon - This method involves inserting a catheter with a balloon tip into the uterus. The balloon tip is filled with heated fluid to remove the lining of the uterus. °AFTER THE PROCEDURE  °After your procedure, do   not have sexual intercourse or insert anything into your vagina until permitted by your health care provider. After the procedure, you may experience: °· Cramps. °· Vaginal discharge. °· Frequent urination. °  °This information is not intended to replace advice given to you by your health care provider. Make sure you discuss any  questions you have with your health care provider. °  °Document Released: 02/08/2004 Document Revised: 12/20/2014 Document Reviewed: 09/01/2012 °Elsevier Interactive Patient Education ©2016 Elsevier Inc. ° °

## 2015-06-11 NOTE — Progress Notes (Signed)
Patient ID: Sherry [name], female   DOB: 08-28-1980, 35 y.o.   MRN: [id number] History:  35 y.o. [id number] here today for hosp f/u for AUB. Pt received a transfusion of 4 units of PRBC's.  Dr. Elonda Husky discussed with her endometrial ablation and pt desires to have that scheduled.  She reports that her bleeding is improved now on Megace compared to when she was in the hosp.  She still feels weak.   The following portions of the patient's history were reviewed and updated as appropriate: allergies, current medications, past family history, past medical history, past social history, past surgical history and problem list.  Review of Systems:  Pertinent items are noted in HPI.  Objective:  Physical Exam Blood pressure 131/80, pulse 64, temperature 98.2 F (36.8 C), temperature source Oral, height 5\' 5"  (1.651 m), weight 185 lb 3.2 oz (84.006 kg), unknown if currently breastfeeding. Gen: NAD  CBC Latest Ref Rng 06/07/2015 06/06/2015 05/10/2015  WBC 4.0 - 10.5 K/uL 7.1 6.2 -  Hemoglobin 12.0 - 15.0 g/dL 9.6(L) 4.9(LL) 7.1(L)  Hematocrit 36.0 - 46.0 % 30.7(L) 17.9(L) 21.0(L)  Platelets 150 - 400 K/uL 357 348 -    Labs and Imaging US Transvaginal Non-ob  06/06/2015  CLINICAL DATA:  Dysfunctional uterine bleeding. EXAM: TRANSABDOMINAL AND TRANSVAGINAL ULTRASOUND OF PELVIS TECHNIQUE: Both transabdominal and transvaginal ultrasound examinations of the pelvis were performed. Transabdominal technique was performed for global imaging of the pelvis including uterus, ovaries, adnexal regions, and pelvic cul-de-sac. It was necessary to proceed with endovaginal exam following the transabdominal exam to visualize the uterus and ovaries. COMPARISON:  None FINDINGS: Uterus Measurements: 11.5 x 5.6 x 5.9 cm. Retroverted without evidence for fibroids. Myometrial echotexture is slightly heterogeneous. Endometrium Thickness: 7 mm. Difficult to visualize given uterine position and anatomy. Right ovary Measurements: 4.6 x 1.9 x  1.6 cm. Normal appearance/no adnexal mass. Left ovary Measurements: 4.1 x 2.9 x 3.0 cm. 2.9 cm simple cyst in the ovary. Other findings No abnormal free fluid. IMPRESSION: 1. Slight heterogeneity of the myometrial echotexture. Imaging features raise the question of adenomyosis. 2. Otherwise unremarkable study. Electronically Signed   By: Misty Stanley M.D.   On: 06/06/2015 21:07   US Pelvis Complete  06/06/2015  CLINICAL DATA:  Dysfunctional uterine bleeding. EXAM: TRANSABDOMINAL AND TRANSVAGINAL ULTRASOUND OF PELVIS TECHNIQUE: Both transabdominal and transvaginal ultrasound examinations of the pelvis were performed. Transabdominal technique was performed for global imaging of the pelvis including uterus, ovaries, adnexal regions, and pelvic cul-de-sac. It was necessary to proceed with endovaginal exam following the transabdominal exam to visualize the uterus and ovaries. COMPARISON:  None FINDINGS: Uterus Measurements: 11.5 x 5.6 x 5.9 cm. Retroverted without evidence for fibroids. Myometrial echotexture is slightly heterogeneous. Endometrium Thickness: 7 mm. Difficult to visualize given uterine position and anatomy. Right ovary Measurements: 4.6 x 1.9 x 1.6 cm. Normal appearance/no adnexal mass. Left ovary Measurements: 4.1 x 2.9 x 3.0 cm. 2.9 cm simple cyst in the ovary. Other findings No abnormal free fluid. IMPRESSION: 1. Slight heterogeneity of the myometrial echotexture. Imaging features raise the question of adenomyosis. 2. Otherwise unremarkable study. Electronically Signed   By: Misty Stanley M.D.   On: 06/06/2015 21:07    Assessment & Plan:  AUB s/p transfusion Sx anemia.  CBC today Patient desires surgical management with hysteroscopy and endometrial ablation  The risks of surgery were discussed in detail with the patient including but not limited to: bleeding which may require transfusion or reoperation; infection which may require prolonged  hospitalization or re-hospitalization and  antibiotic therapy; injury to bowel, bladder, ureters and major vessels or other surrounding organs; need for additional procedures including laparotomy; thromboembolic phenomenon, incisional problems and other postoperative or anesthesia complications.  Patient was told that the likelihood that her condition and symptoms will be treated effectively with this surgical management was very high; the postoperative expectations were also discussed in detail. The patient also understands the alternative treatment options which were discussed in full. All questions were answered.  She was told that she will be contacted by our surgical scheduler regarding the time and date of her surgery; routine preoperative instructions of having nothing to eat or drink after midnight on the day prior to surgery and also coming to the hospital 1 1/2 hours prior to her time of surgery were also emphasized.  She was told she may be called for a preoperative appointment about a week prior to surgery and will be given further preoperative instructions at that visit. Printed patient education handouts about the procedure were given to the patient to review at home.  Lyell Clugston L. Harraway-Smith, M.D., Cherlynn June

## 2015-06-13 ENCOUNTER — Encounter (HOSPITAL_COMMUNITY): Payer: Self-pay | Admitting: *Deleted

## 2015-06-19 NOTE — Patient Instructions (Signed)
Your procedure is scheduled on:  Wednesday, June 27, 2015  Enter through the [employer] [facility] at:  7:00 AM  Pick up the phone at the desk and dial [phone].  Call this number if you have problems the morning of surgery: [phone].  Remember:  Do NOT eat food or drink after:  Midnight Tuesday  Take these medicines the morning of surgery with a SIP OF WATER:  None  Do NOT wear jewelry (body piercing), metal hair clips/bobby pins, make-up, or nail polish. Do NOT wear lotions, powders, or perfumes.  You may wear deoderant. Do NOT shave for 48 hours prior to surgery. Do NOT bring valuables to the hospital. Contacts, dentures, or bridgework may not be worn into surgery.  Have a responsible adult drive you home and stay with you for 24 hours after your procedure

## 2015-06-20 ENCOUNTER — Encounter (HOSPITAL_COMMUNITY): Payer: Self-pay

## 2015-06-20 ENCOUNTER — Encounter (HOSPITAL_COMMUNITY)
Admission: RE | Admit: 2015-06-20 | Discharge: 2015-06-20 | Disposition: A | Discharge status: Home / Self Care | Payer: Medicaid Other | Source: Ambulatory Visit | Attending: Obstetrics & Gynecology | Admitting: Obstetrics & Gynecology

## 2015-06-20 DIAGNOSIS — Z01812 Encounter for preprocedural laboratory examination: Secondary | ICD-10-CM | POA: Insufficient documentation

## 2015-06-20 HISTORY — DX: Reserved for inherently not codable concepts without codable children: IMO0001

## 2015-06-20 LAB — CBC
HEMATOCRIT: 34.5 % — AB (ref 36.0–46.0)
HEMOGLOBIN: 10.7 g/dL — AB (ref 12.0–15.0)
MCH: 24.2 pg — AB (ref 26.0–34.0)
MCHC: 31 g/dL (ref 30.0–36.0)
MCV: 78.1 fL (ref 78.0–100.0)
Platelets: 344 10*3/uL (ref 150–400)
RBC: 4.42 MIL/uL (ref 3.87–5.11)
RDW: 19.2 % — ABNORMAL HIGH (ref 11.5–15.5)
WBC: 9.4 10*3/uL (ref 4.0–10.5)

## 2015-06-27 ENCOUNTER — Encounter (HOSPITAL_COMMUNITY)
Admission: RE | Disposition: A | Discharge status: Home / Self Care | Payer: Self-pay | Source: Ambulatory Visit | Attending: Obstetrics & Gynecology

## 2015-06-27 ENCOUNTER — Encounter (HOSPITAL_COMMUNITY): Payer: Self-pay | Admitting: Emergency Medicine

## 2015-06-27 ENCOUNTER — Ambulatory Visit (HOSPITAL_COMMUNITY): Payer: Medicaid Other | Admitting: Anesthesiology

## 2015-06-27 ENCOUNTER — Ambulatory Visit (HOSPITAL_COMMUNITY)
Admission: RE | Admit: 2015-06-27 | Discharge: 2015-06-27 | Disposition: A | Discharge status: Home / Self Care | Payer: Medicaid Other | Source: Ambulatory Visit | Attending: Obstetrics & Gynecology | Admitting: Obstetrics & Gynecology

## 2015-06-27 DIAGNOSIS — N938 Other specified abnormal uterine and vaginal bleeding: Secondary | ICD-10-CM | POA: Diagnosis not present

## 2015-06-27 DIAGNOSIS — D649 Anemia, unspecified: Secondary | ICD-10-CM | POA: Diagnosis not present

## 2015-06-27 DIAGNOSIS — N939 Abnormal uterine and vaginal bleeding, unspecified: Secondary | ICD-10-CM

## 2015-06-27 DIAGNOSIS — N840 Polyp of corpus uteri: Secondary | ICD-10-CM | POA: Diagnosis not present

## 2015-06-27 DIAGNOSIS — K219 Gastro-esophageal reflux disease without esophagitis: Secondary | ICD-10-CM | POA: Insufficient documentation

## 2015-06-27 DIAGNOSIS — F329 Major depressive disorder, single episode, unspecified: Secondary | ICD-10-CM | POA: Diagnosis not present

## 2015-06-27 DIAGNOSIS — Z79899 Other long term (current) drug therapy: Secondary | ICD-10-CM | POA: Diagnosis not present

## 2015-06-27 DIAGNOSIS — F419 Anxiety disorder, unspecified: Secondary | ICD-10-CM | POA: Diagnosis not present

## 2015-06-27 HISTORY — PX: HYSTEROSCOPY WITH NOVASURE: SHX5574

## 2015-06-27 HISTORY — DX: Abnormal uterine and vaginal bleeding, unspecified: N93.9

## 2015-06-27 LAB — PREGNANCY, URINE: PREG TEST UR: NEGATIVE

## 2015-06-27 SURGERY — HYSTEROSCOPY WITH NOVASURE
Anesthesia: General | Site: Vagina

## 2015-06-27 MED ORDER — FENTANYL CITRATE (PF) 100 MCG/2ML IJ SOLN
INTRAMUSCULAR | Status: AC
  Filled 2015-06-27: qty 2

## 2015-06-27 MED ORDER — SCOPOLAMINE 1 MG/3DAYS TD PT72
MEDICATED_PATCH | TRANSDERMAL | Status: AC
  Filled 2015-06-27: qty 1

## 2015-06-27 MED ORDER — FENTANYL CITRATE (PF) 100 MCG/2ML IJ SOLN
INTRAMUSCULAR | Status: DC | PRN
  Administered 2015-06-27: 25 ug via INTRAVENOUS
  Administered 2015-06-27: 50 ug via INTRAVENOUS
  Administered 2015-06-27: 25 ug via INTRAVENOUS

## 2015-06-27 MED ORDER — LIDOCAINE HCL (CARDIAC) 20 MG/ML IV SOLN
INTRAVENOUS | Status: DC | PRN
  Administered 2015-06-27: 80 mg via INTRAVENOUS

## 2015-06-27 MED ORDER — LACTATED RINGERS IV SOLN: INTRAVENOUS | Status: DC

## 2015-06-27 MED ORDER — DEXAMETHASONE SODIUM PHOSPHATE 10 MG/ML IJ SOLN
INTRAMUSCULAR | Status: DC | PRN
  Administered 2015-06-27: 4 mg via INTRAVENOUS

## 2015-06-27 MED ORDER — ONDANSETRON HCL 4 MG/2ML IJ SOLN
INTRAMUSCULAR | Status: AC
  Filled 2015-06-27: qty 2

## 2015-06-27 MED ORDER — PROMETHAZINE HCL 25 MG/ML IJ SOLN: 6.2500 mg | INTRAMUSCULAR | Status: DC | PRN

## 2015-06-27 MED ORDER — BUPIVACAINE HCL (PF) 0.5 % IJ SOLN
INTRAMUSCULAR | Status: DC | PRN
  Administered 2015-06-27: 10 mL

## 2015-06-27 MED ORDER — PROPOFOL 10 MG/ML IV BOLUS
INTRAVENOUS | Status: AC
  Filled 2015-06-27: qty 20

## 2015-06-27 MED ORDER — ONDANSETRON HCL 4 MG/2ML IJ SOLN
INTRAMUSCULAR | Status: DC | PRN
  Administered 2015-06-27: 4 mg via INTRAVENOUS

## 2015-06-27 MED ORDER — MIDAZOLAM HCL 2 MG/2ML IJ SOLN
INTRAMUSCULAR | Status: AC
  Filled 2015-06-27: qty 2

## 2015-06-27 MED ORDER — BUPIVACAINE HCL (PF) 0.5 % IJ SOLN
INTRAMUSCULAR | Status: AC
  Filled 2015-06-27: qty 30

## 2015-06-27 MED ORDER — DEXAMETHASONE SODIUM PHOSPHATE 4 MG/ML IJ SOLN
INTRAMUSCULAR | Status: AC
  Filled 2015-06-27: qty 1

## 2015-06-27 MED ORDER — MIDAZOLAM HCL 2 MG/2ML IJ SOLN
INTRAMUSCULAR | Status: DC | PRN
  Administered 2015-06-27: 2 mg via INTRAVENOUS

## 2015-06-27 MED ORDER — FENTANYL CITRATE (PF) 100 MCG/2ML IJ SOLN
25.0000 ug | INTRAMUSCULAR | Status: DC | PRN
  Administered 2015-06-27: 25 ug via INTRAVENOUS

## 2015-06-27 MED ORDER — LACTATED RINGERS IV SOLN
INTRAVENOUS | Status: DC
  Administered 2015-06-27: 09:00:00 via INTRAVENOUS
  Administered 2015-06-27: 125 mL/h via INTRAVENOUS

## 2015-06-27 MED ORDER — SCOPOLAMINE 1 MG/3DAYS TD PT72
1.0000 | MEDICATED_PATCH | Freq: Once | TRANSDERMAL | Status: DC
  Administered 2015-06-27: 1.5 mg via TRANSDERMAL

## 2015-06-27 MED ORDER — KETOROLAC TROMETHAMINE 30 MG/ML IJ SOLN
INTRAMUSCULAR | Status: DC | PRN
  Administered 2015-06-27: 30 mg via INTRAVENOUS

## 2015-06-27 MED ORDER — LACTATED RINGERS IV SOLN
INTRAVENOUS | Status: DC | PRN
  Administered 2015-06-27: 3000 mL via INTRAUTERINE

## 2015-06-27 MED ORDER — IBUPROFEN 600 MG PO TABS: 600.0000 mg | ORAL_TABLET | Freq: Four times a day (QID) | ORAL | Status: DC | PRN

## 2015-06-27 MED ORDER — PROPOFOL 10 MG/ML IV BOLUS
INTRAVENOUS | Status: DC | PRN
  Administered 2015-06-27: 200 mg via INTRAVENOUS

## 2015-06-27 SURGICAL SUPPLY — 14 items
ABLATOR ENDOMETRIAL BIPOLAR (ABLATOR) ×2 IMPLANT
CATH ROBINSON RED A/P 16FR (CATHETERS) ×2 IMPLANT
CLOTH BEACON ORANGE TIMEOUT ST (SAFETY) ×2 IMPLANT
CONTAINER PREFILL 10% NBF 60ML (FORM) IMPLANT
GLOVE BIO SURGEON STRL SZ7 (GLOVE) ×2 IMPLANT
GLOVE BIOGEL PI IND STRL 7.0 (GLOVE) ×2 IMPLANT
GLOVE BIOGEL PI INDICATOR 7.0 (GLOVE) ×2
GOWN STRL REUS W/TWL LRG LVL3 (GOWN DISPOSABLE) ×4 IMPLANT
GOWN STRL REUS W/TWL XL LVL3 (GOWN DISPOSABLE) ×2 IMPLANT
PACK VAGINAL MINOR WOMEN LF (CUSTOM PROCEDURE TRAY) ×2 IMPLANT
PAD OB MATERNITY 4.3X12.25 (PERSONAL CARE ITEMS) ×2 IMPLANT
TOWEL OR 17X24 6PK STRL BLUE (TOWEL DISPOSABLE) ×4 IMPLANT
TUBING AQUILEX INFLOW (TUBING) ×2 IMPLANT
WATER STERILE IRR 1000ML POUR (IV SOLUTION) ×2 IMPLANT

## 2015-06-27 NOTE — H&P (Signed)
35 y.o. [id number] here today for hosp f/u for AUB. Pt received a transfusion of 4 units of PRBC's. Dr. Elonda Husky discussed with her endometrial ablation and pt desires to have that scheduled. She reports that her bleeding is improved now on Megace compared to when she was in the hosp. She still feels weak.   The following portions of the patient's history were reviewed and updated as appropriate: allergies, current medications, past family history, past medical history, past social history, past surgical history and problem list.  Past Medical History  Diagnosis Date  . Depression   . Anxiety   . H/O chlamydia infection   . GERD (gastroesophageal reflux disease)     with pregnancy  . Severe anemia 06/06/2015  . Shortness of breath dyspnea     with exertion   Past Surgical History  Procedure Laterality Date  . Cesarean section      x2  . Dental surgery  2010    6 teeth surgically removed  . Bartholin cyst marsupialization    . Cesarean section with bilateral tubal ligation N/A 04/03/2014    Procedure: CESAREAN SECTION WITH BILATERAL TUBAL LIGATION;  Surgeon: Jerelyn Charles, MD;  Location: [facility];  Service: Obstetrics;  Laterality: N/A;   No current facility-administered medications on file prior to encounter.   Current Outpatient Prescriptions on File Prior to Encounter  Medication Sig Dispense Refill  . megestrol (MEGACE) 40 MG tablet Take 1 tablet (40 mg total) by mouth 2 (two) times daily. 60 tablet 1  . Multiple Vitamins-Minerals (ALIVE WOMENS GUMMY) CHEW Chew 1 each by mouth daily.     No Known Allergies  Review of Systems:  Pertinent items are noted in HPI.  Objective:  Physical Exam Blood pressure 131/80, pulse 64, temperature 98.2 F (36.8 C), temperature source Oral, height 5\' 5"  (1.651 m), weight 185 lb 3.2 oz (84.006 kg), unknown if currently breastfeeding. Gen: NAD  CBC Latest Ref Rng 06/07/2015 06/06/2015 05/10/2015  WBC 4.0 - 10.5 K/uL 7.1 6.2 -   Hemoglobin 12.0 - 15.0 g/dL 9.6(L) 4.9(LL) 7.1(L)  Hematocrit 36.0 - 46.0 % 30.7(L) 17.9(L) 21.0(L)  Platelets 150 - 400 K/uL 357 348 -    Labs and Imaging  Imaging Results    US Transvaginal Non-ob  06/06/2015 CLINICAL DATA: Dysfunctional uterine bleeding. EXAM: TRANSABDOMINAL AND TRANSVAGINAL ULTRASOUND OF PELVIS TECHNIQUE: Both transabdominal and transvaginal ultrasound examinations of the pelvis were performed. Transabdominal technique was performed for global imaging of the pelvis including uterus, ovaries, adnexal regions, and pelvic cul-de-sac. It was necessary to proceed with endovaginal exam following the transabdominal exam to visualize the uterus and ovaries. COMPARISON: None FINDINGS: Uterus Measurements: 11.5 x 5.6 x 5.9 cm. Retroverted without evidence for fibroids. Myometrial echotexture is slightly heterogeneous. Endometrium Thickness: 7 mm. Difficult to visualize given uterine position and anatomy. Right ovary Measurements: 4.6 x 1.9 x 1.6 cm. Normal appearance/no adnexal mass. Left ovary Measurements: 4.1 x 2.9 x 3.0 cm. 2.9 cm simple cyst in the ovary. Other findings No abnormal free fluid. IMPRESSION: 1. Slight heterogeneity of the myometrial echotexture. Imaging features raise the question of adenomyosis. 2. Otherwise unremarkable study. Electronically Signed By: Misty Stanley M.D. On: 06/06/2015 21:07   US Pelvis Complete  06/06/2015 CLINICAL DATA: Dysfunctional uterine bleeding. EXAM: TRANSABDOMINAL AND TRANSVAGINAL ULTRASOUND OF PELVIS TECHNIQUE: Both transabdominal and transvaginal ultrasound examinations of the pelvis were performed. Transabdominal technique was performed for global imaging of the pelvis including uterus, ovaries, adnexal regions, and pelvic cul-de-sac. It was necessary to proceed  with endovaginal exam following the transabdominal exam to visualize the uterus and ovaries. COMPARISON: None FINDINGS: Uterus Measurements: 11.5 x 5.6 x 5.9  cm. Retroverted without evidence for fibroids. Myometrial echotexture is slightly heterogeneous. Endometrium Thickness: 7 mm. Difficult to visualize given uterine position and anatomy. Right ovary Measurements: 4.6 x 1.9 x 1.6 cm. Normal appearance/no adnexal mass. Left ovary Measurements: 4.1 x 2.9 x 3.0 cm. 2.9 cm simple cyst in the ovary. Other findings No abnormal free fluid. IMPRESSION: 1. Slight heterogeneity of the myometrial echotexture. Imaging features raise the question of adenomyosis. 2. Otherwise unremarkable study. Electronically Signed By: Misty Stanley M.D. On: 06/06/2015 21:07     Assessment & Plan:  AUB s/p transfusion Sx anemia.   Patient desires surgical management with hysteroscopy and endometrial ablation The risks of surgery were discussed in detail with the patient including but not limited to: bleeding which may require transfusion or reoperation; infection which may require prolonged hospitalization or re-hospitalization and antibiotic therapy; injury to bowel, bladder, ureters and major vessels or other surrounding organs; need for additional procedures including laparotomy; thromboembolic phenomenon, incisional problems and other postoperative or anesthesia complications. Patient was told that the likelihood that her condition and symptoms will be treated effectively with this surgical management was very high; the postoperative expectations were also discussed in detail. The patient also understands the alternative treatment options which were discussed in full. All questions were answered.   Francee Setzer L. Harraway-Smith, M.D., Cherlynn June

## 2015-06-27 NOTE — Brief Op Note (Signed)
06/27/2015  12:00 PM  PATIENT:  Sherry [name]  35 y.o. female  PRE-OPERATIVE DIAGNOSIS:   Slight heterogeneity of the myometrial echotexture, question of adenomyosis  POST-OPERATIVE DIAGNOSIS:   Slight heterogeneity of the myometrial echotexture, question   PROCEDURE:  Procedure(s): HYSTEROSCOPY WITH NOVASURE (N/A)  SURGEON:  Surgeon(s) and Role:    * Lavonia Drafts, MD - Primary  ANESTHESIA:   general  EBL:  Total I/O In: 600 [I.V.:600] Out: 100 [Urine:50; Blood:50]  BLOOD ADMINISTERED:none  DRAINS: none   LOCAL MEDICATIONS USED:  MARCAINE     SPECIMEN:  Source of Specimen:  endometrial curettings  DISPOSITION OF SPECIMEN:  PATHOLOGY  COUNTS:  YES  TOURNIQUET:  * No tourniquets in log *  DICTATION: .Note written in EPIC  PLAN OF CARE: Discharge to home after PACU  PATIENT DISPOSITION:  PACU - hemodynamically stable.   Delay start of Pharmacological VTE agent (>24hrs) due to surgical blood loss or risk of bleeding: not applicable  Complications: none  Tyarra Nolton L. Harraway-Smith, M.D., Cherlynn June

## 2015-06-27 NOTE — Anesthesia Procedure Notes (Signed)
Procedure Name: LMA Insertion Date/Time: 06/27/2015 10:54 AM Performed by: Bufford Spikes Pre-anesthesia Checklist: Patient identified, Timeout performed, Emergency Drugs available, Suction available and Patient being monitored Patient Re-evaluated:Patient Re-evaluated prior to inductionOxygen Delivery Method: Circle system utilized Preoxygenation: Pre-oxygenation with 100% oxygen Intubation Type: IV induction Ventilation: Mask ventilation without difficulty LMA: LMA inserted LMA Size: 4.0 Tube type: Oral Number of attempts: 1 Placement Confirmation: positive ETCO2 and breath sounds checked- equal and bilateral Tube secured with: Tape Dental Injury: Teeth and Oropharynx as per pre-operative assessment

## 2015-06-27 NOTE — Op Note (Signed)
06/27/2015  12:00 PM  PATIENT:  Sherry [name]  35 y.o. female  PRE-OPERATIVE DIAGNOSIS:   Slight heterogeneity of the myometrial echotexture, question of adenomyosis  POST-OPERATIVE DIAGNOSIS:   Slight heterogeneity of the myometrial echotexture, question   PROCEDURE:  Procedure(s): HYSTEROSCOPY WITH NOVASURE (N/A)  SURGEON:  Surgeon(s) and Role:    * Lavonia Drafts, MD - Primary  ANESTHESIA:   general  EBL:  Total I/O In: 600 [I.V.:600] Out: 100 [Urine:50; Blood:50]  BLOOD ADMINISTERED:none  DRAINS: none   LOCAL MEDICATIONS USED:  MARCAINE     SPECIMEN:  Source of Specimen:  endometrial curettings  DISPOSITION OF SPECIMEN:  PATHOLOGY  COUNTS:  YES  TOURNIQUET:  * No tourniquets in log *  DICTATION: .Note written in EPIC  PLAN OF CARE: Discharge to home after PACU  PATIENT DISPOSITION:  PACU - hemodynamically stable.   Delay start of Pharmacological VTE agent (>24hrs) due to surgical blood loss or risk of bleeding: not applicable  Complications: none  The risks, benefits, and alternatives of surgery were explained, understood, and accepted. The consents were signed and all questions were answered. She was taken to the operating room and general anesthesia was applied without complication. She was placed in the dorsal lithotomy position and her vagina and abdomen were prepped and draped in the usual sterile fashion. A bimanual exam revealed a normal size and shape anteverted mobile uterus. Her adnexa were non-enlarged.   A bivalved speculum was placed in the patients' vagina and the anterior lip of the cervix was grasped with a single toothed tenaculum. A paracervical block was performed at 5 and 7 o'clock with 10cc of 0.5% Marcaine.   The endometrial cavity was sounded to 11 cm and the endocervical length measured 3cm. A hysteroscope was inserted and the endometrium was noted to be thickened with several polyps.  The ostia on both sides were noted.  The scope was  removed and a sharp currete was used to scape the lining of the uterus until a gritty texture was noted throughout.  Specimens were sent to pathology.  The NovaSure device was then inserted and seated using 6.5cm as the cavity length and 4.4cm as the cavity width.  The total activation time was 120 sec at a power of 157.  The hysteroscope was reinserted and an even burn pattern was noted to the fundus.  The single toothed tenaculum was removed at the end of the case and no bleeding was noted from the cervix.   The patient was extubated and taken to the recovery room in stable condition.  Sponge, lap and instrument counts were correct.  There were no complications.   Sherry [name], M.D., Sherry [name]

## 2015-06-27 NOTE — Discharge Instructions (Signed)
Hysteroscopy, Care After Refer to this sheet in the next few weeks. These instructions provide you with information on caring for yourself after your procedure. Your health care provider may also give you more specific instructions. Your treatment has been planned according to current medical practices, but problems sometimes occur. Call your health care provider if you have any problems or questions after your procedure.  WHAT TO EXPECT AFTER THE PROCEDURE After your procedure, it is typical to have the following:  You may have some cramping. This normally lasts for a couple days.  You may have bleeding. This can vary from light spotting for a few days to menstrual-like bleeding for 3-7 days. HOME CARE INSTRUCTIONS  Rest for the first 1-2 days after the procedure.  Only take over-the-counter or prescription medicines as directed by your health care provider. Do not take aspirin. It can increase the chances of bleeding.  Take showers instead of baths for 2 weeks or as directed by your health care provider.  Do not drive for 24 hours or as directed.  Do not drink alcohol while taking pain medicine.  Do not use tampons, douche, or have sexual intercourse for 2 weeks or until your health care provider says it is okay.  Take your temperature twice a day for 4-5 days. Write it down each time.  Follow your health care provider's advice about diet, exercise, and lifting.  If you develop constipation, you may:  Take a mild laxative if your health care provider approves.  Add bran foods to your diet.  Drink enough fluids to keep your urine clear or pale yellow.  Try to have someone with you or available to you for the first 24-48 hours, especially if you were given a general anesthetic.  Follow up with your health care provider as directed. SEEK MEDICAL CARE IF:  You feel dizzy or lightheaded.  You feel sick to your stomach (nauseous).  You have abnormal vaginal discharge.  You  have a rash.  You have pain that is not controlled with medicine. SEEK IMMEDIATE MEDICAL CARE IF:  You have bleeding that is heavier than a normal menstrual period.  You have a fever.  You have increasing cramps or pain, not controlled with medicine.  You have new belly (abdominal) pain.  You pass out.  You have pain in the tops of your shoulders (shoulder strap areas).  You have shortness of breath.   This information is not intended to replace advice given to you by your health care provider. Make sure you discuss any questions you have with your health care provider.   Document Released: 01/19/2013 Document Reviewed: 01/19/2013 Elsevier Interactive Patient Education 2016 [place]. Endometrial Ablation Endometrial ablation removes the lining of the uterus (endometrium). It is usually a same-day, outpatient treatment. Ablation helps avoid major surgery, such as surgery to remove the cervix and uterus (hysterectomy). After endometrial ablation, you will have little or no menstrual bleeding and may not be able to have children. However, if you are premenopausal, you will need to use a reliable method of birth control following the procedure because of the small chance that pregnancy can occur. There are different reasons to have this procedure. These reasons include:  Heavy periods.  Bleeding that is causing anemia.  Irregular bleeding.  Bleeding fibroids on the lining inside the uterus if they are smaller than 3 centimeters. This procedure may not be possible for you if:   You want to have children in the future.  You have severe cramps with your menstrual period.   You have precancerous or cancerous cells in your uterus.   You were recently pregnant.   You have gone through menopause.   You have had major surgery on your uterus, resulting in thinning of the uterine wall. Surgeries may include:  The removal of one or more uterine fibroids (myomectomy).  A  cesarean section with a classic (vertical) incision on your uterus. Ask your health care provider what type of cesarean you had. Sometimes the scar on your skin is different than the scar on your uterus. Even if you have had surgery on your uterus, certain types of ablation may still be safe for you. Talk with your health care provider. LET [facility] CARE PROVIDER KNOW ABOUT:  Any allergies you have.  All medicines you are taking, including vitamins, herbs, eye drops, creams, and over-the-counter medicines.  Previous problems you or members of your family have had with the use of anesthetics.  Any blood disorders you have.  Previous surgeries you have had.  Medical conditions you have. RISKS AND COMPLICATIONS  Generally, this is a safe procedure. However, as with any procedure, complications can occur. Possible complications include:  Perforation of the uterus.  Bleeding.  Infection of the uterus, bladder, or vagina.  Injury to surrounding organs.  An air bubble to the lung (air embolus).  Pregnancy following the procedure.  Failure of the procedure to help the problem, requiring hysterectomy.  Decreased ability to diagnose cancer in the lining of the uterus. BEFORE THE PROCEDURE  The lining of the uterus must be tested to make sure there is no pre-cancerous or cancer cells present.  An ultrasound may be performed to look at the size of the uterus and to check for abnormalities.  Medicines may be given to thin the lining of the uterus. PROCEDURE  During the procedure, your health care provider will use a tool called a resectoscope to help see inside your uterus. There are different ways to remove the lining of your uterus.   Radiofrequency - This method uses a radiofrequency-alternating electric current to remove the lining of the uterus.  Cryotherapy - This method uses extreme cold to freeze the lining of the uterus.  Heated-Free Liquid - This method uses heated salt  (saline) solution to remove the lining of the uterus.  Microwave - This method uses high-energy microwaves to heat up the lining of the uterus to remove it.  Thermal balloon - This method involves inserting a catheter with a balloon tip into the uterus. The balloon tip is filled with heated fluid to remove the lining of the uterus. AFTER THE PROCEDURE  After your procedure, do not have sexual intercourse or insert anything into your vagina until permitted by your health care provider. After the procedure, you may experience:  Cramps.  Vaginal discharge.  Frequent urination.   This information is not intended to replace advice given to you by your health care provider. Make sure you discuss any questions you have with your health care provider.   Document Released: 02/08/2004 Document Revised: 12/20/2014 Document Reviewed: 09/01/2012 Elsevier Interactive Patient Education 2016 [place] Anesthesia Home Care Instructions  Activity: Get plenty of rest for the remainder of the day. A responsible adult should stay with you for 24 hours following the procedure.  For the next 24 hours, DO NOT: -Drive a car -Paediatric nurse -Drink alcoholic beverages -Take any medication unless instructed by your physician -Make any legal decisions  or sign important papers.  Meals: Start with liquid foods such as gelatin or soup. Progress to regular foods as tolerated. Avoid greasy, spicy, heavy foods. If nausea and/or vomiting occur, drink only clear liquids until the nausea and/or vomiting subsides. Call your physician if vomiting continues.  Special Instructions/Symptoms: Your throat may feel dry or sore from the anesthesia or the breathing tube placed in your throat during surgery. If this causes discomfort, gargle with warm salt water. The discomfort should disappear within 24 hours.  If you had a scopolamine patch placed behind your ear for the management of post- operative nausea  and/or vomiting:  1. The medication in the patch is effective for 72 hours, after which it should be removed.  Wrap patch in a tissue and discard in the trash. Wash hands thoroughly with soap and water. 2. You may remove the patch earlier than 72 hours if you experience unpleasant side effects which may include dry mouth, dizziness or visual disturbances. 3. Avoid touching the patch. Wash your hands with soap and water after contact with the patch.

## 2015-06-27 NOTE — Transfer of Care (Signed)
Immediate Anesthesia Transfer of Care Note  Patient: Sherry [name]  Procedure(s) Performed: Procedure(s): HYSTEROSCOPY WITH NOVASURE (N/A)  Patient Location: PACU  Anesthesia Type:General  Level of Consciousness: awake, alert  and oriented  Airway & Oxygen Therapy: Patient Spontanous Breathing and Patient connected to nasal cannula oxygen  Post-op Assessment: Report given to RN and Post -op Vital signs reviewed and stable  Post vital signs: Reviewed and stable  Last Vitals:  Filed Vitals:   06/27/15 0718 06/27/15 1138  BP: 129/85 126/87  Pulse: 80 75  Temp: 36.7 C 37.1 C  Resp: 18 16    Complications: No apparent anesthesia complications

## 2015-06-27 NOTE — Anesthesia Postprocedure Evaluation (Signed)
Anesthesia Post Note  Patient: Sherry [name]  Procedure(s) Performed: Procedure(s) (LRB): HYSTEROSCOPY WITH NOVASURE (N/A)  Patient location during evaluation: PACU Anesthesia Type: General Level of consciousness: awake and alert Pain management: pain level controlled Vital Signs Assessment: post-procedure vital signs reviewed and stable Respiratory status: spontaneous breathing, nonlabored ventilation, respiratory function stable and patient connected to nasal cannula oxygen Cardiovascular status: blood pressure returned to baseline and stable Postop Assessment: no signs of nausea or vomiting Anesthetic complications: no    Last Vitals:  Filed Vitals:   06/27/15 0718 06/27/15 1138  BP: 129/85 126/87  Pulse: 80 75  Temp: 36.7 C 37.1 C  Resp: 18 16    Last Pain: There were no vitals filed for this visit.               Zenaida Deed

## 2015-06-27 NOTE — Anesthesia Preprocedure Evaluation (Addendum)
Anesthesia Evaluation  Patient identified by MRN, date of birth, ID band Patient awake    Reviewed: Allergy & Precautions, H&P , NPO status , Patient's Chart, lab work & pertinent test results  History of Anesthesia Complications Negative for: history of anesthetic complications  Airway Mallampati: II  TM Distance: >3 FB Neck ROM: full    Dental no notable dental hx.    Pulmonary neg pulmonary ROS,    Pulmonary exam normal breath sounds clear to auscultation       Cardiovascular negative cardio ROS Normal cardiovascular exam Rhythm:regular Rate:Normal     Neuro/Psych negative neurological ROS     GI/Hepatic negative GI ROS, Neg liver ROS,   Endo/Other  negative endocrine ROS  Renal/GU negative Renal ROS     Musculoskeletal   Abdominal   Peds  Hematology negative hematology ROS (+)   Anesthesia Other Findings   Reproductive/Obstetrics                            Anesthesia Physical Anesthesia Plan  ASA: II  Anesthesia Plan: General   Post-op Pain Management:    Induction: Intravenous  Airway Management Planned: LMA  Additional Equipment:   Intra-op Plan:   Post-operative Plan:   Informed Consent: I have reviewed the patients History and Physical, chart, labs and discussed the procedure including the risks, benefits and alternatives for the proposed anesthesia with the patient or authorized representative who has indicated his/her understanding and acceptance.   Dental Advisory Given  Plan Discussed with: Anesthesiologist, CRNA and Surgeon  Anesthesia Plan Comments: (Depression, anxiety, anemia, GERD)        Anesthesia Quick Evaluation

## 2015-06-28 ENCOUNTER — Encounter (HOSPITAL_COMMUNITY): Payer: Self-pay | Admitting: Obstetrics & Gynecology

## 2015-07-03 ENCOUNTER — Other Ambulatory Visit: Payer: Self-pay | Admitting: Family Medicine

## 2015-07-26 NOTE — Discharge Summary (Signed)
Physician Discharge Summary  Patient ID: Sherry [name] MRN: [id number] DOB/AGE: 07-19-1980 35 y.o.  Admit date: 06/06/2015 Discharge date: 07/26/2015  Admission Diagnoses:menometrorrhagia, severe symptomatic anemis  Discharge Diagnoses:  Active Problems:   Severe anemia   Symptomatic anemia   Discharged Condition: stable  Hospital Course: admitted for transfusion 4 units PRBC, bleeding stopped on IV premarin + megestrol  Consults: None  Significant Diagnostic Studies: labs:   Treatments: transfusion  Discharge Exam: Blood pressure 105/63, pulse 73, temperature 97.6 F (36.4 C), temperature source Oral, resp. rate 18, height 5\' 6"  (1.676 m), weight 200 lb (90.719 kg), last menstrual period 05/10/2015, SpO2 100 %, unknown if currently breastfeeding. General appearance: alert, cooperative and no distress Resp: clear to auscultation bilaterally GI: soft, non-tender; bowel sounds normal; no masses,  no organomegaly  Disposition: 01-Home or Self Care  Discharge Instructions    Increase activity slowly    Complete by:  As directed             Medication List    STOP taking these medications        megestrol 40 MG tablet  Commonly known as:  MEGACE     oxyCODONE-acetaminophen 5-325 MG tablet  Commonly known as:  PERCOCET/ROXICET     prenatal multivitamin Tabs tablet           Follow-up Information    Follow up with [facility] In 2 weeks.   Contact information:   [place] [phone]      Signed: Florian Buff 07/26/2015, 8:11 AM

## 2015-08-03 ENCOUNTER — Encounter: Payer: Self-pay | Admitting: Obstetrics & Gynecology

## 2015-08-03 ENCOUNTER — Ambulatory Visit (INDEPENDENT_AMBULATORY_CARE_PROVIDER_SITE_OTHER): Payer: Self-pay | Admitting: Obstetrics & Gynecology

## 2015-08-03 VITALS — BP 136/80 | HR 77 | Ht 65.0 in | Wt 196.5 lb

## 2015-08-03 DIAGNOSIS — Z9889 Other specified postprocedural states: Secondary | ICD-10-CM

## 2015-08-03 NOTE — Progress Notes (Signed)
Patient ID: Lorenza Cambridge, female   DOB: [birth year], 35 y.o.   MRN: [id number] History:  35 y.o. [id number] here today for post op check.  Pt with no problems. Bleeding resolved.   The following portions of the patient's history were reviewed and updated as appropriate: allergies, current medications, past family history, past medical history, past social history, past surgical history and problem list.  Review of Systems:  Pertinent items are noted in HPI.  Objective:  Physical Exam Blood pressure 136/80, pulse 77, height 5\' 5"  (1.651 m), weight 196 lb 8 oz (89.132 kg), unknown if currently breastfeeding. Gen: NAD Abd: Soft, nontender and nondistended  Labs and Imaging 06/27/2015 Diagnosis Endometrium, curettage SECRETARY ENDOMETRIUM AND ENDOMETRIAL POLYP NEGATIVE FOR HYPERPLASIA OR MALIGNANCY  Assessment & Plan:  4 week post op check after hysteroscopy and endometrial ablation F/u in 1 year or sooner prn  Gerold Sar L. Harraway-Smith, M.D., Cherlynn June

## 2016-09-25 ENCOUNTER — Other Ambulatory Visit (HOSPITAL_COMMUNITY)
Admission: RE | Admit: 2016-09-25 | Discharge: 2016-09-25 | Disposition: A | Discharge status: Home / Self Care | Payer: 59 | Source: Ambulatory Visit | Attending: Obstetrics & Gynecology | Admitting: Obstetrics & Gynecology

## 2016-09-25 ENCOUNTER — Encounter: Payer: Self-pay | Admitting: Obstetrics & Gynecology

## 2016-09-25 ENCOUNTER — Ambulatory Visit (INDEPENDENT_AMBULATORY_CARE_PROVIDER_SITE_OTHER): Payer: 59 | Admitting: Clinical

## 2016-09-25 ENCOUNTER — Ambulatory Visit (INDEPENDENT_AMBULATORY_CARE_PROVIDER_SITE_OTHER): Payer: 59 | Admitting: Obstetrics & Gynecology

## 2016-09-25 VITALS — BP 148/80 | HR 74 | Ht 66.0 in | Wt 191.0 lb

## 2016-09-25 DIAGNOSIS — R5383 Other fatigue: Secondary | ICD-10-CM | POA: Diagnosis not present

## 2016-09-25 DIAGNOSIS — Z113 Encounter for screening for infections with a predominantly sexual mode of transmission: Secondary | ICD-10-CM | POA: Diagnosis not present

## 2016-09-25 DIAGNOSIS — Z01419 Encounter for gynecological examination (general) (routine) without abnormal findings: Secondary | ICD-10-CM | POA: Insufficient documentation

## 2016-09-25 DIAGNOSIS — F329 Major depressive disorder, single episode, unspecified: Secondary | ICD-10-CM

## 2016-09-25 DIAGNOSIS — F330 Major depressive disorder, recurrent, mild: Secondary | ICD-10-CM

## 2016-09-25 DIAGNOSIS — F4323 Adjustment disorder with mixed anxiety and depressed mood: Secondary | ICD-10-CM | POA: Diagnosis not present

## 2016-09-25 NOTE — Progress Notes (Signed)
Subjective:     Sherry [name] is a 36 y.o. female here for a routine exam.  Current complaints: pt reports fatigue that she thinks is due to depression. She was prev on meds but, stopped due to lack of ins.  Her depression screen was positive today and she wants to see the behaviorist prior to leaving.     Gynecologic History No LMP recorded (lmp unknown). Contraception: tubal ligation Last Pap: 09/06/2013. Results were: normal Last mammogram: never had.   Obstetric History OB History  Gravida Para Term Preterm AB Living  '3 3 3     2  ' SAB TAB Ectopic Multiple Live Births        0 3    # Outcome Date GA Lbr Len/2nd Weight Sex Delivery Anes PTL Lv  3 Term 04/03/14 [redacted]w[redacted]d 7 lb 12.2 oz (3.52 kg) F CS-LTranv Spinal  LIV  2 Term 04/2010 [id number] lb 14 oz (4.026 kg) F CS-Unspec   LIV  1 Term 04/2002 4049w0d lb 13 oz (3.544 kg) M CS-Unspec         The following portions of the patient's history were reviewed and updated as appropriate: allergies, current medications, past family history, past medical history, past social history, past surgical history and problem list.  Review of Systems Pertinent items are noted in HPI.    Objective:  BP (!) 148/80   Pulse 74   Ht '5\' 6"'  (1.676 m)   Wt 191 lb (86.6 kg)   LMP  (LMP Unknown)   Breastfeeding? No   BMI 30.83 kg/m  General Appearance:    Alert, cooperative, no distress, appears stated age  Head:    Normocephalic, without obvious abnormality, atraumatic  Eyes:    conjunctiva/corneas clear, EOM's intact, both eyes  Ears:    Normal external ear canals, both ears  Nose:   Nares normal, septum midline, mucosa normal, no drainage    or sinus tenderness  Throat:   Lips, mucosa, and tongue normal; teeth and gums normal  Neck:   Supple, symmetrical, trachea midline, no adenopathy;    thyroid:  no enlargement/tenderness/nodules  Back:     Symmetric, no curvature, ROM normal, no CVA tenderness  Lungs:     Clear to auscultation bilaterally,  respirations unlabored  Chest Wall:    No tenderness or deformity   Heart:    Regular rate and rhythm, S1 and S2 normal, no murmur, rub   or gallop  Breast Exam:    No tenderness, masses, or nipple abnormality  Abdomen:     Soft, non-tender, bowel sounds active all four quadrants,    no masses, no organomegaly  Genitalia:    Normal female without lesion, discharge or tenderness     Extremities:   Extremities normal, atraumatic, no cyanosis or edema  Pulses:   2+ and symmetric all extremities  Skin:   Skin color, texture, turgor normal, no rashes or lesions     Assessment:    Healthy female exam.   Depression- met with Jamie. Wants to get back on meds. Cannot get in to se primary care for a prolonged time    Plan:    Follow up in: 3 months.  Celexa 2m34m q day F/u with primary care. F/u PAP with hrHPV Labs: Vit D, CBC, TSH HgbA1C  STI screen:  RPR, HIV, Hep B&C  Alee Gressman L. Harraway-Smith, M.D., FACOCherlynn June

## 2016-09-25 NOTE — BH Specialist Note (Signed)
Integrated Behavioral Health Initial Visit  MRN: [id number] Name: Sherry [name]   Session Start time: 3:20 Session End time: 3:50 Total time: 30 minutes  Type of Service: [place] Interpretor:No. Interpretor Name and Language: n/a   Warm Hand Off Completed.       SUBJECTIVE: Sherry [name] is a 36 y.o. female accompanied by patient. Patient was referred by Dr Ihor Dow for anxiety and depression. Patient reports the following symptoms/concerns: Pt states her primary concern is feeling overwhelmed and anxious at work; open to learning self-coping strategy to manage feelings, and would like to start Celexa again, as it helped "for years". Duration of problem: Less than three months; Severity of problem: moderate  OBJECTIVE: Mood: Anxious and Depressed and Affect: Appropriate Risk of harm to self or others: No plan to harm self or others   LIFE CONTEXT: Family and Social: Lives with husband and three children School/Work: Works part-time, in Medical illustrator, medical coding) Self-Care: Spending time with children Life Changes: Started new job less than 3 months ago  GOALS ADDRESSED: Patient will reduce symptoms of: anxiety and depression and increase knowledge and/or ability of: self-management skills and also: Increase healthy adjustment to current life circumstances   INTERVENTIONS: Mindfulness or Relaxation Training and Psychoeducation and/or Health Education  Standardized Assessments completed: GAD-7 and PHQ 9  ASSESSMENT: Patient currently experiencing Adjustment disorder with anxious and depressed mood. Patient may benefit from psychoeducation and brief therapeutic intervention regarding coping with symptoms of anxiety and depression.  PLAN: 1. Follow up with behavioral health clinician on : Three weeks, if symptoms do not improve 2. Behavioral recommendations:  -Begin BH meds today, as prescribed by medical  provider -CALM relaxation breathing exercise daily, prior to work; before bed, if needed -Sleep app for improved family sleep -Read educational materials regarding coping with symptoms of anxiety and depression -Consider Insurance website([facility]) for psychiatry options with new insurance 3. Referral(s): [facility] [employer] [facility] (In Clinic)  Caroleen Hamman [place], [place]  Depression screen [facility] 2/9 09/25/2016  Decreased Interest 2  Down, Depressed, Hopeless 2  PHQ - 2 Score 4  Altered sleeping 2  Tired, decreased energy 3  Change in appetite 0  Feeling bad or failure about yourself  2  Trouble concentrating 0  Moving slowly or fidgety/restless 1  Suicidal thoughts 0  PHQ-9 Score 12   GAD 7 : Generalized Anxiety Score 09/25/2016  Nervous, Anxious, on Edge 3  Control/stop worrying 3  Worry too much - different things 3  Trouble relaxing 1  Restless 0  Easily annoyed or irritable 2  Afraid - awful might happen 2  Total GAD 7 Score 14

## 2016-09-25 NOTE — Progress Notes (Signed)
Pt agreeable to see Roselyn Reef for high gad and phq9 score.

## 2016-09-26 LAB — COMPREHENSIVE METABOLIC PANEL
ALK PHOS: 82 IU/L (ref 39–117)
ALT: 9 IU/L (ref 0–32)
AST: 19 IU/L (ref 0–40)
Albumin/Globulin Ratio: 1.7 (ref 1.2–2.2)
Albumin: 4.2 g/dL (ref 3.5–5.5)
BILIRUBIN TOTAL: 0.3 mg/dL (ref 0.0–1.2)
BUN / CREAT RATIO: 9 (ref 9–23)
BUN: 8 mg/dL (ref 6–20)
CHLORIDE: 104 mmol/L (ref 96–106)
CO2: 25 mmol/L (ref 20–29)
Calcium: 9.5 mg/dL (ref 8.7–10.2)
Creatinine, Ser: 0.85 mg/dL (ref 0.57–1.00)
GFR calc Af Amer: 103 mL/min/{1.73_m2} (ref >59)
GFR calc non Af Amer: 90 mL/min/{1.73_m2} (ref >59)
GLUCOSE: 79 mg/dL (ref 65–99)
Globulin, Total: 2.5 g/dL (ref 1.5–4.5)
Potassium: 4.3 mmol/L (ref 3.5–5.2)
Sodium: 142 mmol/L (ref 134–144)
Total Protein: 6.7 g/dL (ref 6.0–8.5)

## 2016-09-26 LAB — CBC
Hematocrit: 39.7 % (ref 34.0–46.6)
Hemoglobin: 13.2 g/dL (ref 11.1–15.9)
MCH: 29.3 pg (ref 26.6–33.0)
MCHC: 33.2 g/dL (ref 31.5–35.7)
MCV: 88 fL (ref 79–97)
PLATELETS: 299 10*3/uL (ref 150–379)
RBC: 4.51 x10E6/uL (ref 3.77–5.28)
RDW: 14.3 % (ref 12.3–15.4)
WBC: 8.4 10*3/uL (ref 3.4–10.8)

## 2016-09-26 LAB — RPR: RPR Ser Ql: NONREACTIVE

## 2016-09-26 LAB — HIV ANTIBODY (ROUTINE TESTING W REFLEX): HIV SCREEN 4TH GENERATION: NONREACTIVE

## 2016-09-26 LAB — TSH: TSH: 1.45 u[IU]/mL (ref 0.450–4.500)

## 2016-09-26 LAB — HEPATITIS B SURFACE ANTIGEN: HEP B S AG: NEGATIVE

## 2016-09-26 LAB — HEMOGLOBIN A1C
ESTIMATED AVERAGE GLUCOSE: 105 mg/dL
HEMOGLOBIN A1C: 5.3 % (ref 4.8–5.6)

## 2016-09-26 MED ORDER — CITALOPRAM HYDROBROMIDE 20 MG PO TABS: 20.0000 mg | ORAL_TABLET | Freq: Every day | ORAL | 2 refills | Status: DC

## 2016-09-30 LAB — CYTOLOGY - PAP
BACTERIAL VAGINITIS: POSITIVE — AB
Candida vaginitis: NEGATIVE
Chlamydia: NEGATIVE
DIAGNOSIS: NEGATIVE
HPV: NOT DETECTED
NEISSERIA GONORRHEA: NEGATIVE
Trichomonas: NEGATIVE

## 2016-10-01 ENCOUNTER — Other Ambulatory Visit: Payer: Self-pay | Admitting: Obstetrics & Gynecology

## 2016-10-01 MED ORDER — METRONIDAZOLE 500 MG PO TABS: 500.0000 mg | ORAL_TABLET | Freq: Two times a day (BID) | ORAL | 0 refills | Status: DC

## 2016-10-02 ENCOUNTER — Encounter: Payer: Self-pay | Admitting: *Deleted

## 2016-10-02 NOTE — Progress Notes (Signed)
Mychart message to patient with results.

## 2016-10-14 ENCOUNTER — Telehealth: Payer: Self-pay | Admitting: Clinical

## 2016-10-14 NOTE — Telephone Encounter (Signed)
Left HIPPA-compliant message to return call to Sherry [name] at [facility] at [phone].      

## 2016-12-25 ENCOUNTER — Telehealth: Payer: Self-pay | Admitting: *Deleted

## 2016-12-25 NOTE — Telephone Encounter (Signed)
Called Sherry [name] and left message on her personal voice mail stating that we received a fax from her pharmacy for refill request of Citalopram 20 mg. We need to know if she does need this medication and also if she has scheduled appt with a psychiatrist as recommended by Roselyn Reef at last visit on 09/25/16. I asked Sherry [name] to call back and leave a message on nurse voice mail with this information. Per Dr. Ihor Dow, Sherry [name] needs to be informed that this office does not do on-going care for depression. She can be given a 30 Carvell Hoeffner supply of medication ONLY and after that we will not provide additional refills.  Sherry [name] must establish care with a psychiatrist for this medication.  Sherry [name] may also schedule follow up appt with Roselyn Reef if desired.

## 2016-12-29 NOTE — Telephone Encounter (Signed)
LM that we are calling in regards to refill [facility] that [facility].  Please give [place] a call back if you are needing the refill; we do have information for you about the refill.

## 2017-01-01 NOTE — Telephone Encounter (Signed)
Sherry [name] called this afternoon and left a voicemessage she is returning a call about getting a [facility] [address]

## 2017-01-01 NOTE — Telephone Encounter (Signed)
Sherry [name] called and left a message 12/31/16 pm that she received our message and that she has 3  Pills left and no refills and she is supposed to make theraphy appointment with [place].

## 2017-01-05 ENCOUNTER — Telehealth: Payer: Self-pay | Admitting: General Practice

## 2017-01-05 NOTE — Telephone Encounter (Signed)
Patient called and left message stating she is calling regarding a prescription [facility]ill that [facility] [address]. Called patient and she states she hasn't been able to get in with a PCP that she likes yet. Recommended [facility] on [address] and told patient I would call her back regarding refill once I spoke with Dr Ihor Dow. Patient verbalized understanding & had no questions.  Spoke with Dr Ihor Dow who states patient needs to come in for follow up before meds can be refilled. Called & discussed with patient. Patient verbalized understanding & will await phone call from front office staff with appt.

## 2017-01-08 ENCOUNTER — Ambulatory Visit (INDEPENDENT_AMBULATORY_CARE_PROVIDER_SITE_OTHER): Payer: 59 | Admitting: Obstetrics & Gynecology

## 2017-01-08 ENCOUNTER — Encounter: Payer: Self-pay | Admitting: Obstetrics & Gynecology

## 2017-01-08 VITALS — BP 125/85 | HR 77 | Wt 183.3 lb

## 2017-01-08 DIAGNOSIS — F329 Major depressive disorder, single episode, unspecified: Secondary | ICD-10-CM | POA: Diagnosis not present

## 2017-01-08 DIAGNOSIS — F32A Depression, unspecified: Secondary | ICD-10-CM

## 2017-01-08 DIAGNOSIS — F419 Anxiety disorder, unspecified: Secondary | ICD-10-CM | POA: Diagnosis not present

## 2017-01-08 MED ORDER — CITALOPRAM HYDROBROMIDE 40 MG PO TABS: 40.0000 mg | ORAL_TABLET | Freq: Every day | ORAL | 3 refills | Status: DC

## 2017-01-08 NOTE — Patient Instructions (Signed)

## 2017-01-08 NOTE — Progress Notes (Signed)
History:  36 y.o. [id number] here today for f/u of depression. Pt reports that her sx have improved but, she still reports anxeity. She did nor f/u with primary care as requested.  She reports that she was prev on Celexa with relief but was on 61m. Would like to increase the dosase.   The following portions of the patient's history were reviewed and updated as appropriate: allergies, current medications, past family history, past medical history, past social history, past surgical history and problem list.  Review of Systems:  Pertinent items are noted in HPI.   Objective:  Physical Exam Blood pressure 125/85, pulse 77, weight 183 lb 4.8 oz (83.1 kg). CONSTITUTIONAL: Well-developed, well-nourished female in no acute distress.  HENT:  Normocephalic, atraumatic EYES: Conjunctivae and EOM are normal. No scleral icterus.  NECK: Normal range of motion SKIN: Skin is warm and dry. No rash noted. Not diaphoretic.No pallor. [place] Alert and oriented to person, place, and time. Normal coordination.    Assessment & Plan:  Depression and anexiety.   Increase Celexa to 468mdaily.  F/u with primary care to manage depression  Pt will f/u with [facility].  She met prev with JaRoselyn ReefShe reports that she wants to go to thecounseling ctr. She is aware that her depression screen is elevated.  Total face-to-face time with patient was 15 min.  Greater than 50% was spent in counseling and coordination of care with the patient.   Ellsie Violette L. Harraway-Smith, M.D., FACherlynn June

## 2017-01-08 NOTE — Progress Notes (Signed)
Elevate PHQ-9

## 2017-01-22 ENCOUNTER — Ambulatory Visit (INDEPENDENT_AMBULATORY_CARE_PROVIDER_SITE_OTHER): Payer: 59 | Admitting: Family Medicine

## 2017-01-22 ENCOUNTER — Encounter: Payer: Self-pay | Admitting: Family Medicine

## 2017-01-22 VITALS — BP 122/78 | HR 71 | Temp 98.2°F | Resp 15 | Ht 65.5 in | Wt 181.4 lb

## 2017-01-22 DIAGNOSIS — Z7689 Persons encountering health services in other specified circumstances: Secondary | ICD-10-CM

## 2017-01-22 DIAGNOSIS — F339 Major depressive disorder, recurrent, unspecified: Secondary | ICD-10-CM | POA: Insufficient documentation

## 2017-01-22 MED ORDER — CITALOPRAM HYDROBROMIDE 40 MG PO TABS: 40.0000 mg | ORAL_TABLET | Freq: Every day | ORAL | 3 refills | Status: DC

## 2017-01-22 NOTE — Patient Instructions (Addendum)
Living With Depression Everyone experiences occasional disappointment, sadness, and loss in their lives. When you are feeling down, blue, or sad for at least 2 weeks in a row, it may mean that you have depression. Depression can affect your thoughts and feelings, relationships, daily activities, and physical health. It is caused by changes in the way your brain functions. If you receive a diagnosis of depression, your health care provider will tell you which type of depression you have and what treatment options are available to you. If you are living with depression, there are ways to help you recover from it and also ways to prevent it from coming back. How to cope with lifestyle changes Coping with stress Stress is your body's reaction to life changes and events, both good and bad. Stressful situations may include:  Getting married.  The death of a spouse.  Losing a job.  Retiring.  Having a baby.  Stress can last just a few hours or it can be ongoing. Stress can play a major role in depression, so it is important to learn both how to cope with stress and how to think about it differently. Talk with your health care provider or a counselor if you would like to learn more about stress reduction. He or she may suggest some stress reduction techniques, such as:  Music therapy. This can include creating music or listening to music. Choose music that you enjoy and that inspires you.  Mindfulness-based meditation. This kind of meditation can be done while sitting or walking. It involves being aware of your normal breaths, rather than trying to control your breathing.  Centering prayer. This is a kind of meditation that involves focusing on a spiritual word or phrase. Choose a word, phrase, or sacred image that is meaningful to you and that brings you peace.  Deep breathing. To do this, expand your stomach and inhale slowly through your nose. Hold your breath for 3-5 seconds, then exhale  slowly, allowing your stomach muscles to relax.  Muscle relaxation. This involves intentionally tensing muscles then relaxing them.  Choose a stress reduction technique that fits your lifestyle and personality. Stress reduction techniques take time and practice to develop. Set aside 5-15 minutes a day to do them. Therapists can offer training in these techniques. The training may be covered by some insurance plans. Other things you can do to manage stress include:  Keeping a stress diary. This can help you learn what triggers your stress and ways to control your response.  Understanding what your limits are and saying no to requests or events that lead to a schedule that is too full.  Thinking about how you respond to certain situations. You may not be able to control everything, but you can control how you react.  Adding humor to your life by watching funny films or TV shows.  Making time for activities that help you relax and not feeling guilty about spending your time this way.  Medicines Your health care provider may suggest certain medicines if he or she feels that they will help improve your condition. Avoid using alcohol and other substances that may prevent your medicines from working properly (may interact). It is also important to:  Talk with your pharmacist or health care provider about all the medicines that you take, their possible side effects, and what medicines are safe to take together.  Make it your goal to take part in all treatment decisions (shared decision-making). This includes giving input on the side   effects of medicines. It is best if shared decision-making with your health care provider is part of your total treatment plan.  If your health care provider prescribes a medicine, you may not notice the full benefits of it for 4-8 weeks. Most people who are treated for depression need to be on medicine for at least 6-12 months after they feel better. If you are taking  medicines as part of your treatment, do not stop taking medicines without first talking to your health care provider. You may need to have the medicine slowly decreased (tapered) over time to decrease the risk of harmful side effects. Relationships Your health care provider may suggest family therapy along with individual therapy and drug therapy. While there may not be family problems that are causing you to feel depressed, it is still important to make sure your family learns as much as they can about your mental health. Having your family's support can help make your treatment successful. How to recognize changes in your condition Everyone has a different response to treatment for depression. Recovery from major depression happens when you have not had signs of major depression for two months. This may mean that you will start to:  Have more interest in doing activities.  Feel less hopeless than you did 2 months ago.  Have more energy.  Overeat less often, or have better or improving appetite.  Have better concentration.  Your health care provider will work with you to decide the next steps in your recovery. It is also important to recognize when your condition is getting worse. Watch for these signs:  Having fatigue or low energy.  Eating too much or too little.  Sleeping too much or too little.  Feeling restless, agitated, or hopeless.  Having trouble concentrating or making decisions.  Having unexplained physical complaints.  Feeling irritable, angry, or aggressive.  Get help as soon as you or your family members notice these symptoms coming back. How to get support and help from others How to talk with friends and family members about your condition Talking to friends and family members about your condition can provide you with one way to get support and guidance. Reach out to trusted friends or family members, explain your symptoms to them, and let them know that you are  working with a health care provider to treat your depression. Financial resources Not all insurance plans cover mental health care, so it is important to check with your insurance carrier. If paying for co-pays or counseling services is a problem, search for a local or county mental health care center. They may be able to offer public mental health care services at low or no cost when you are not able to see a private health care provider. If you are taking medicine for depression, you may be able to get the generic form, which may be less expensive. Some makers of prescription medicines also offer help to patients who cannot afford the medicines they need. Follow these instructions at home:  Get the right amount and quality of sleep.  Cut down on using caffeine, tobacco, alcohol, and other potentially harmful substances.  Try to exercise, such as walking or lifting small weights.  Take over-the-counter and prescription medicines only as told by your health care provider.  Eat a healthy diet that includes plenty of vegetables, fruits, whole grains, low-fat dairy products, and lean protein. Do not eat a lot of foods that are high in solid fats, added sugars, or salt.    Keep all follow-up visits as told by your health care provider. This is important. Contact a health care provider if:  You stop taking your antidepressant medicines, and you have any of these symptoms: ? Nausea. ? Headache. ? Feeling lightheaded. ? Chills and body aches. ? Not being able to sleep (insomnia).  You or your friends and family think your depression is getting worse. Get help right away if:  You have thoughts of hurting yourself or others. If you ever feel like you may hurt yourself or others, or have thoughts about taking your own life, get help right away. You can go to your nearest emergency department or call:  Your local emergency services (911 in the [place]  A suicide crisis helpline, such as the  National Suicide Prevention Lifeline at 1-800-273-8255. This is open 24-hours a day.  Summary  If you are living with depression, there are ways to help you recover from it and also ways to prevent it from coming back.  Work with your health care team to create a management plan that includes counseling, stress management techniques, and healthy lifestyle habits. This information is not intended to replace advice given to you by your health care provider. Make sure you discuss any questions you have with your health care provider. Document Released: 03/03/2016 Document Revised: 03/03/2016 Document Reviewed: 03/03/2016 Elsevier Interactive Patient Education  2018 Elsevier Inc.  

## 2017-01-22 NOTE — Progress Notes (Signed)
Patient presents to clinic today to establish care.  SUBJECTIVE: PMH: Pt is a 36 yo female with pmh sig for Depression.  She was previously seen by Dr. Ihor Dow at [facility].  Depression: -started on Celexa by Ob/Gyn.  Dose increased on 01/08/17 to 40 mg. -Pt endorses doing ok on Celexa -states in the past was on a different medication, but had better results with Celexa -pt endorses depression since age 35 -endorses decreased energy, states "I have to push it".  Attribute some of the decreased energy to working 2nd shift (gets off at midnight) -pt states her sleep and appetite are good. -Pt to start counseling with [facility] from the [facility] next wk.  Allergies: NKDA  PSurgHx: Hysteroscopy 2017 Tubal ligation 2015 colposcopy  2006 Dental surgery 2010 C-section x 3 2004, 2012, 2015  Social Hx: Pt works as an Production manager.  She has done some college.  Pt has 3 kids.  She does not use tobacco products or drugs.  She drinks EtOH occasionally.   FMHx: Mom-alive, arthritis, diabetes, HTN Dad-deceased, early death, drug abuse, depression, mental illness Brother-Kevin, alive MGM-deceased, depression, HTN, mental illness-requiring hospitalization MGF-deceased PGM-deceased PGF-deceased   Health Maintenance: Dental -- a few yrs ago.  States she did not have the money to have repairs done. Vision -- a yr or two ago at [employer] Immunizations --influenza 2018, TB test 2018.   Colonoscopy --2006 PAP -- followed by Ob/gyn   Past Medical History:  Diagnosis Date  . Anxiety   . Depression   . GERD (gastroesophageal reflux disease)    with pregnancy  . H/O chlamydia infection   . Severe anemia 06/06/2015  . Shortness of breath dyspnea    with exertion    Past Surgical History:  Procedure Laterality Date  . BARTHOLIN CYST MARSUPIALIZATION    . CESAREAN SECTION     x2  . CESAREAN SECTION WITH BILATERAL TUBAL LIGATION  N/A 04/03/2014   Procedure: CESAREAN SECTION WITH BILATERAL TUBAL LIGATION;  Surgeon: Jerelyn Charles, MD;  Location: [facility];  Service: Obstetrics;  Laterality: N/A;  . DENTAL SURGERY  2010   6 teeth surgically removed  . HYSTEROSCOPY WITH NOVASURE N/A 06/27/2015   Procedure: HYSTEROSCOPY WITH NOVASURE;  Surgeon: Lavonia Drafts, MD;  Location: [facility];  Service: Gynecology;  Laterality: N/A;    Current Outpatient Prescriptions on File Prior to Visit  Medication Sig Dispense Refill  . Multiple Vitamins-Minerals (ALIVE WOMENS GUMMY) CHEW Chew 1 each by mouth daily.     No current facility-administered medications on file prior to visit.     No Known Allergies  Family History  Problem Relation Age of Onset  . Diabetes Mother        Type 2  . Hypertension Mother   . Depression Father   . Diabetes Maternal Aunt   . Hypertension Maternal Aunt   . Hypertension Maternal Aunt     Social History   Social History  . Marital status: Married    Spouse name: N/A  . Number of children: N/A  . Years of education: N/A   Occupational History  . Not on file.   Social History Main Topics  . Smoking status: Never Smoker  . Smokeless tobacco: Never Used  . Alcohol use No  . Drug use: No  . Sexual activity: No   Other Topics Concern  . Not on file   Social History Narrative  ROS General: Denies fever, chills, night sweats, changes in weight, changes in appetite HEENT: Denies headaches, ear pain, changes in vision, rhinorrhea, sore throat CV: Denies CP, palpitations, SOB, orthopnea Pulm: Denies SOB, cough, wheezing GI: Denies abdominal pain, nausea, vomiting, diarrhea, constipation GU: Denies dysuria, hematuria, frequency, vaginal discharge Msk: Denies muscle cramps, joint pains Neuro: Denies weakness, numbness, tingling Skin: Denies rashes, bruising Psych: Denies anxiety, hallucinations  +depression  BP 122/78 (BP Location: Right Arm, Patient Position:  Sitting, Cuff Size: Normal)   Pulse 71   Temp 98.2 F (36.8 C) (Oral)   Resp 15   Ht 5' 5.5" (1.664 m)   Wt 181 lb 7 oz (82.3 kg)   LMP  (LMP Unknown) Comment: some time in Sept.  SpO2 97%   BMI 29.73 kg/m   Physical Exam Gen. Pleasant, well developed, well-nourished, in NAD HEENT - wearing glasses. [place]/AT, PERRL, no scleral icterus, no nasal drainage, pharynx without erythema or exudate, poor dentition-multiple carries. Lungs: no accessory muscle use, CTAB, no wheezes, rales or rhonchi Cardiovascular: RRR, No r/g/m, no peripheral edema Abdomen: BS present, soft, nontender,nondistended, no hepatosplenomegaly Musculoskeletal: No deformities, moves all four extremities, no cyanosis or clubbing, normal tone Neuro:  A&Ox3, CN II-XII intact, normal gait Skin:  Warm, dry, intact, no lesions Psych: normal affect, mood appropriate  No results found for this or any previous visit (from the past 2160 hour(s)).  Assessment/Plan: Depression, recurrent (HCC) -PHQ-9 score 7 -Advised to proceed with counseling at [facility] to continue Celexa 40 mg - Plan: citalopram (CELEXA) 40 MG tablet -f/u in 1 month  Encounter to establish care -records release form

## 2017-02-18 MED FILL — CITALOPRAM HBR 40 MG TABLET: 40 | 90 days supply | Qty: 90 | Fill #0

## 2017-02-19 ENCOUNTER — Ambulatory Visit: Payer: Self-pay | Admitting: Family Medicine

## 2017-02-26 ENCOUNTER — Ambulatory Visit: Payer: Self-pay | Admitting: Family Medicine

## 2017-03-03 ENCOUNTER — Encounter: Payer: Self-pay | Admitting: Family Medicine

## 2017-03-03 ENCOUNTER — Ambulatory Visit (INDEPENDENT_AMBULATORY_CARE_PROVIDER_SITE_OTHER): Payer: 59 | Admitting: Family Medicine

## 2017-03-03 VITALS — BP 130/90 | HR 79 | Temp 97.7°F | Wt 181.7 lb

## 2017-03-03 DIAGNOSIS — F329 Major depressive disorder, single episode, unspecified: Secondary | ICD-10-CM | POA: Diagnosis not present

## 2017-03-03 DIAGNOSIS — F5101 Primary insomnia: Secondary | ICD-10-CM

## 2017-03-03 DIAGNOSIS — F32A Depression, unspecified: Secondary | ICD-10-CM

## 2017-03-03 DIAGNOSIS — F419 Anxiety disorder, unspecified: Secondary | ICD-10-CM

## 2017-03-03 NOTE — Progress Notes (Signed)
Subjective:    Patient ID: Sherry [name], female    DOB: [birth year], 36 y.o.   MRN: [id number]  Chief Complaint  Patient presents with  . Follow-up    HPI Patient was seen today for f/u on anxiety and depression.  History of depression since age 62.  States has been doing well on Celexa 40 mg, feels like it is helping.  Works late shifts, gets off ~midnight.  This am had difficulty falling asleep 2/2 mind racing.  Pt has not yet been to sleep.  This is the 1st night this has happened.  Pt went to counseling at [facility], but did not like the experience.  Pt is willing to try another counselor.  Denies HI/SI.  Patient does endorse feeling a little bit more withdrawn at work, such as not eating lunch with her coworkers.  In general patient is more of an introvert.  Past Medical History:  Diagnosis Date  . Anxiety   . Depression   . GERD (gastroesophageal reflux disease)    with pregnancy  . H/O chlamydia infection   . Severe anemia 06/06/2015  . Shortness of breath dyspnea    with exertion    No Known Allergies  ROS General: Denies fever, chills, night sweats, changes in weight, changes in appetite HEENT: Denies headaches, ear pain, changes in vision, rhinorrhea, sore throat CV: Denies CP, palpitations, SOB, orthopnea Pulm: Denies SOB, cough, wheezing GI: Denies abdominal pain, nausea, vomiting, diarrhea, constipation GU: Denies dysuria, hematuria, frequency, vaginal discharge Msk: Denies muscle cramps, joint pains Neuro: Denies weakness, numbness, tingling Skin: Denies rashes, bruising Psych: Denies hallucinations  + anxiety, depression     Objective:    Blood pressure 130/90, pulse 79, temperature 97.7 F (36.5 C), temperature source Oral, weight 181 lb 11.2 oz (82.4 kg).   Gen. Pleasant, well-nourished, in no distress, normal affect HEENT: [place]/AT, face symmetric, no scleral icterus, PERRLA, nares patent without drainage Lungs: no accessory muscle use, CTAB, no  wheezes or rales Cardiovascular: RRR, no m/r/g, no peripheral edema Neuro:  A&Ox3, CN II-XII intact, normal gait Psych: Mood appropriate, affect normal, appears slightly tired.  Wt Readings from Last 3 Encounters:  03/03/17 181 lb 11.2 oz (82.4 kg)  01/22/17 181 lb 7 oz (82.3 kg)  01/08/17 183 lb 4.8 oz (83.1 kg)    Lab Results  Component Value Date   WBC 8.4 09/25/2016   HGB 13.2 09/25/2016   HCT 39.7 09/25/2016   PLT 299 09/25/2016   GLUCOSE 79 09/25/2016   CHOL 165 09/16/2012   TRIG 133.0 09/16/2012   HDL 49.30 09/16/2012   LDLCALC 89 09/16/2012   ALT 9 09/25/2016   AST 19 09/25/2016   NA 142 09/25/2016   K 4.3 09/25/2016   CL 104 09/25/2016   CREATININE 0.85 09/25/2016   BUN 8 09/25/2016   CO2 25 09/25/2016   TSH 1.450 09/25/2016   INR 1.22 06/06/2015   HGBA1C 5.3 09/25/2016    Assessment/Plan:  Anxiety and depression -PHQ 9 given score 4.  This is slightly improved from last visit when score was a 7. -Patient to continue Celexa 40 mg daily -Patient given information about area mental health providers for counseling.  Patient to call and schedule her own appointment -Patient given handout on ways to cope with anxiety.  Also discussed helpful tools to keep her mind from racing at night when she is trying to sleep. -Patient to follow-up in 1 month, sooner if needed   Primary insomnia -Discussed  sleep hygiene -Given handout on above -Discussed trying OTC melatonin for sleep as needed -If still having difficulty sleeping after taking melatonin patient to make this provider aware. -Follow-up in 1 month

## 2017-03-03 NOTE — Patient Instructions (Addendum)
Living With Anxiety  After being diagnosed with an anxiety disorder, you may be relieved to know why you have felt or behaved a certain way. It is natural to also feel overwhelmed about the treatment ahead and what it will mean for your life. With care and support, you can manage this condition and recover from it.  How to cope with anxiety  Dealing with stress  Stress is your body's reaction to life changes and events, both good and bad. Stress can last just a few hours or it can be ongoing. Stress can play a major role in anxiety, so it is important to learn both how to cope with stress and how to think about it differently.  Talk with your health care provider or a counselor to learn more about stress reduction. He or she may suggest some stress reduction techniques, such as:   Music therapy. This can include creating or listening to music that you enjoy and that inspires you.   Mindfulness-based meditation. This involves being aware of your normal breaths, rather than trying to control your breathing. It can be done while sitting or walking.   Centering prayer. This is a kind of meditation that involves focusing on a word, phrase, or sacred image that is meaningful to you and that brings you peace.   Deep breathing. To do this, expand your stomach and inhale slowly through your nose. Hold your breath for 3-5 seconds. Then exhale slowly, allowing your stomach muscles to relax.   Self-talk. This is a skill where you identify thought patterns that lead to anxiety reactions and correct those thoughts.   Muscle relaxation. This involves tensing muscles then relaxing them.    Choose a stress reduction technique that fits your lifestyle and personality. Stress reduction techniques take time and practice. Set aside 5-15 minutes a day to do them. Therapists can offer training in these techniques. The training may be covered by some insurance plans. Other things you can do to manage stress include:   Keeping a  stress diary. This can help you learn what triggers your stress and ways to control your response.   Thinking about how you respond to certain situations. You may not be able to control everything, but you can control your reaction.   Making time for activities that help you relax, and not feeling guilty about spending your time in this way.    Therapy combined with coping and stress-reduction skills provides the best chance for successful treatment.  Medicines  Medicines can help ease symptoms. Medicines for anxiety include:   Anti-anxiety drugs.   Antidepressants.   Beta-blockers.    Medicines may be used as the main treatment for anxiety disorder, along with therapy, or if other treatments are not working. Medicines should be prescribed by a health care provider.  Relationships  Relationships can play a big part in helping you recover. Try to spend more time connecting with trusted friends and family members. Consider going to couples counseling, taking family education classes, or going to family therapy. Therapy can help you and others better understand the condition.  How to recognize changes in your condition  Everyone has a different response to treatment for anxiety. Recovery from anxiety happens when symptoms decrease and stop interfering with your daily activities at home or work. This may mean that you will start to:   Have better concentration and focus.   Sleep better.   Be less irritable.   Have more energy.   Have improved   memory.    It is important to recognize when your condition is getting worse. Contact your health care provider if your symptoms interfere with home or work and you do not feel like your condition is improving.  Where to find help and support:  You can get help and support from these sources:   Self-help groups.   Online and community organizations.   A trusted spiritual leader.   Couples counseling.   Family education classes.   Family therapy.    Follow these  instructions at home:   Eat a healthy diet that includes plenty of vegetables, fruits, whole grains, low-fat dairy products, and lean protein. Do not eat a lot of foods that are high in solid fats, added sugars, or salt.   Exercise. Most adults should do the following:  ? Exercise for at least 150 minutes each week. The exercise should increase your heart rate and make you sweat (moderate-intensity exercise).  ? Strengthening exercises at least twice a week.   Cut down on caffeine, tobacco, alcohol, and other potentially harmful substances.   Get the right amount and quality of sleep. Most adults need 7-9 hours of sleep each night.   Make choices that simplify your life.   Take over-the-counter and prescription medicines only as told by your health care provider.   Avoid caffeine, alcohol, and certain over-the-counter cold medicines. These may make you feel worse. Ask your pharmacist which medicines to avoid.   Keep all follow-up visits as told by your health care provider. This is important.  Questions to ask your health care provider   Would I benefit from therapy?   How often should I follow up with a health care provider?   How long do I need to take medicine?   Are there any long-term side effects of my medicine?   Are there any alternatives to taking medicine?  Contact a health care provider if:   You have a hard time staying focused or finishing daily tasks.   You spend many hours a day feeling worried about everyday life.   You become exhausted by worry.   You start to have headaches, feel tense, or have nausea.   You urinate more than normal.   You have diarrhea.  Get help right away if:   You have a racing heart and shortness of breath.   You have thoughts of hurting yourself or others.  If you ever feel like you may hurt yourself or others, or have thoughts about taking your own life, get help right away. You can go to your nearest emergency department or call:   Your local emergency  services (911 in the [place]   A suicide crisis helpline, such as the National Suicide Prevention Lifeline at 1-800-273-8255. This is open 24-hours a day.    Summary   Taking steps to deal with stress can help calm you.   Medicines cannot cure anxiety disorders, but they can help ease symptoms.   Family, friends, and partners can play a big part in helping you recover from an anxiety disorder.  This information is not intended to replace advice given to you by your health care provider. Make sure you discuss any questions you have with your health care provider.  Document Released: 03/25/2016 Document Revised: 03/25/2016 Document Reviewed: 03/25/2016  Elsevier Interactive Patient Education  2018 [employer] Inc[employer]      Living With Depression  Everyone experiences occasional disappointment, sadness, and loss in their lives. When you are feeling   down, blue, or sad for at least 2 weeks in a row, it may mean that you have depression. Depression can affect your thoughts and feelings, relationships, daily activities, and physical health. It is caused by changes in the way your brain functions. If you receive a diagnosis of depression, your health care provider will tell you which type of depression you have and what treatment options are available to you.  If you are living with depression, there are ways to help you recover from it and also ways to prevent it from coming back.  How to cope with lifestyle changes  Coping with stress  Stress is your body's reaction to life changes and events, both good and bad. Stressful situations may include:   Getting married.   The death of a spouse.   Losing a job.   Retiring.   Having a baby.    Stress can last just a few hours or it can be ongoing. Stress can play a major role in depression, so it is important to learn both how to cope with stress and how to think about it differently.  Talk with your health care provider or a counselor if you would like to learn more about  stress reduction. He or she may suggest some stress reduction techniques, such as:   Music therapy. This can include creating music or listening to music. Choose music that you enjoy and that inspires you.   Mindfulness-based meditation. This kind of meditation can be done while sitting or walking. It involves being aware of your normal breaths, rather than trying to control your breathing.   Centering prayer. This is a kind of meditation that involves focusing on a spiritual word or phrase. Choose a word, phrase, or sacred image that is meaningful to you and that brings you peace.   Deep breathing. To do this, expand your stomach and inhale slowly through your nose. Hold your breath for 3-5 seconds, then exhale slowly, allowing your stomach muscles to relax.   Muscle relaxation. This involves intentionally tensing muscles then relaxing them.    Choose a stress reduction technique that fits your lifestyle and personality. Stress reduction techniques take time and practice to develop. Set aside 5-15 minutes a day to do them. Therapists can offer training in these techniques. The training may be covered by some insurance plans. Other things you can do to manage stress include:   Keeping a stress diary. This can help you learn what triggers your stress and ways to control your response.   Understanding what your limits are and saying no to requests or events that lead to a schedule that is too full.   Thinking about how you respond to certain situations. You may not be able to control everything, but you can control how you react.   Adding humor to your life by watching funny films or TV shows.   Making time for activities that help you relax and not feeling guilty about spending your time this way.    Medicines  Your health care provider may suggest certain medicines if he or she feels that they will help improve your condition. Avoid using alcohol and other substances that may prevent your medicines from  working properly (may interact). It is also important to:   Talk with your pharmacist or health care provider about all the medicines that you take, their possible side effects, and what medicines are safe to take together.   Make it your goal to take part   in all treatment decisions (shared decision-making). This includes giving input on the side effects of medicines. It is best if shared decision-making with your health care provider is part of your total treatment plan.    If your health care provider prescribes a medicine, you may not notice the full benefits of it for 4-8 weeks. Most people who are treated for depression need to be on medicine for at least 6-12 months after they feel better. If you are taking medicines as part of your treatment, do not stop taking medicines without first talking to your health care provider. You may need to have the medicine slowly decreased (tapered) over time to decrease the risk of harmful side effects.  Relationships  Your health care provider may suggest family therapy along with individual therapy and drug therapy. While there may not be family problems that are causing you to feel depressed, it is still important to make sure your family learns as much as they can about your mental health. Having your family's support can help make your treatment successful.  How to recognize changes in your condition  Everyone has a different response to treatment for depression. Recovery from major depression happens when you have not had signs of major depression for two months. This may mean that you will start to:   Have more interest in doing activities.   Feel less hopeless than you did 2 months ago.   Have more energy.   Overeat less often, or have better or improving appetite.   Have better concentration.    Your health care provider will work with you to decide the next steps in your recovery. It is also important to recognize when your condition is getting worse. Watch  for these signs:   Having fatigue or low energy.   Eating too much or too little.   Sleeping too much or too little.   Feeling restless, agitated, or hopeless.   Having trouble concentrating or making decisions.   Having unexplained physical complaints.   Feeling irritable, angry, or aggressive.    Get help as soon as you or your family members notice these symptoms coming back.  How to get support and help from others  How to talk with friends and family members about your condition  Talking to friends and family members about your condition can provide you with one way to get support and guidance. Reach out to trusted friends or family members, explain your symptoms to them, and let them know that you are working with a health care provider to treat your depression.  Financial resources  Not all insurance plans cover mental health care, so it is important to check with your insurance carrier. If paying for co-pays or counseling services is a problem, search for a local or county mental health care center. They may be able to offer public mental health care services at low or no cost when you are not able to see a private health care provider.  If you are taking medicine for depression, you may be able to get the generic form, which may be less expensive. Some makers of prescription medicines also offer help to patients who cannot afford the medicines they need.  Follow these instructions at home:   Get the right amount and quality of sleep.   Cut down on using caffeine, tobacco, alcohol, and other potentially harmful substances.   Try to exercise, such as walking or lifting small weights.   Take over-the-counter and prescription medicines   only as told by your health care provider.   Eat a healthy diet that includes plenty of vegetables, fruits, whole grains, low-fat dairy products, and lean protein. Do not eat a lot of foods that are high in solid fats, added sugars, or salt.   Keep all follow-up  visits as told by your health care provider. This is important.  Contact a health care provider if:   You stop taking your antidepressant medicines, and you have any of these symptoms:  ? Nausea.  ? Headache.  ? Feeling lightheaded.  ? Chills and body aches.  ? Not being able to sleep (insomnia).   You or your friends and family think your depression is getting worse.  Get help right away if:   You have thoughts of hurting yourself or others.  If you ever feel like you may hurt yourself or others, or have thoughts about taking your own life, get help right away. You can go to your nearest emergency department or call:   Your local emergency services (911 in the [place]   A suicide crisis helpline, such as the National Suicide Prevention Lifeline at 1-800-273-8255. This is open 24-hours a day.    Summary   If you are living with depression, there are ways to help you recover from it and also ways to prevent it from coming back.   Work with your health care team to create a management plan that includes counseling, stress management techniques, and healthy lifestyle habits.  This information is not intended to replace advice given to you by your health care provider. Make sure you discuss any questions you have with your health care provider.  Document Released: 03/03/2016 Document Revised: 03/03/2016 Document Reviewed: 03/03/2016  Elsevier Interactive Patient Education  2018 [employer] Inc[employer]

## 2017-04-02 ENCOUNTER — Ambulatory Visit: Payer: Self-pay | Admitting: Family Medicine

## 2017-04-03 ENCOUNTER — Ambulatory Visit (INDEPENDENT_AMBULATORY_CARE_PROVIDER_SITE_OTHER): Payer: 59 | Admitting: Family Medicine

## 2017-04-03 ENCOUNTER — Encounter: Payer: Self-pay | Admitting: Family Medicine

## 2017-04-03 VITALS — BP 120/70 | HR 69 | Temp 98.3°F | Wt 182.3 lb

## 2017-04-03 DIAGNOSIS — F32A Depression, unspecified: Secondary | ICD-10-CM

## 2017-04-03 DIAGNOSIS — F419 Anxiety disorder, unspecified: Secondary | ICD-10-CM | POA: Diagnosis not present

## 2017-04-03 DIAGNOSIS — F329 Major depressive disorder, single episode, unspecified: Secondary | ICD-10-CM

## 2017-04-03 NOTE — Progress Notes (Signed)
Subjective:    Patient ID: Sherry [name], female    DOB: Jul 29, 1980, 36 y.o.   MRN: [id number]  No chief complaint on file.   HPI Patient was seen today for follow-up on depression/anxiety.  Patient states since last visit she has been doing well on Celexa 40 mg daily.  Some of her stress has decreased as she is finished with school and currently looking for new employment.  Patient is excited as today is her daughter's birthday and they have plans to celebrate throughout the day.  Patient states her sleep has improved some, as has her energy and mood.  Past Medical History:  Diagnosis Date  . Anxiety   . Depression   . GERD (gastroesophageal reflux disease)    with pregnancy  . H/O chlamydia infection   . Severe anemia 06/06/2015  . Shortness of breath dyspnea    with exertion    No Known Allergies  ROS General: Denies fever, chills, night sweats, changes in weight, changes in appetite HEENT: Denies headaches, ear pain, changes in vision, rhinorrhea, sore throat CV: Denies CP, palpitations, SOB, orthopnea Pulm: Denies SOB, cough, wheezing GI: Denies abdominal pain, nausea, vomiting, diarrhea, constipation GU: Denies dysuria, hematuria, frequency, vaginal discharge Msk: Denies muscle cramps, joint pains Neuro: Denies weakness, numbness, tingling Skin: Denies rashes, bruising Psych: Denies hallucinations    + anxiety/depression     Objective:    Blood pressure 120/70, pulse 69, temperature 98.3 F (36.8 C), temperature source Oral, weight 182 lb 4.8 oz (82.7 kg).   Gen. Pleasant, well-nourished, in no distress, normal affect  HEENT: [place]/AT, face symmetric, conjunctiva clear, no scleral icterus, PERRLA, nares patent without drainage Lungs: no accessory muscle use, CTAB, no wheezes or rales Cardiovascular: RRR, no m/r/g, no peripheral edema Neuro:  A&Ox3, CN II-XII intact, normal gait Skin:  Warm, no lesions/ rash   Wt Readings from Last 3 Encounters:  04/03/17 182  lb 4.8 oz (82.7 kg)  03/03/17 181 lb 11.2 oz (82.4 kg)  01/22/17 181 lb 7 oz (82.3 kg)    Lab Results  Component Value Date   WBC 8.4 09/25/2016   HGB 13.2 09/25/2016   HCT 39.7 09/25/2016   PLT 299 09/25/2016   GLUCOSE 79 09/25/2016   CHOL 165 09/16/2012   TRIG 133.0 09/16/2012   HDL 49.30 09/16/2012   LDLCALC 89 09/16/2012   ALT 9 09/25/2016   AST 19 09/25/2016   NA 142 09/25/2016   K 4.3 09/25/2016   CL 104 09/25/2016   CREATININE 0.85 09/25/2016   BUN 8 09/25/2016   CO2 25 09/25/2016   TSH 1.450 09/25/2016   INR 1.22 06/06/2015   HGBA1C 5.3 09/25/2016    Assessment/Plan:  Anxiety and depression  -PHQ 9 score 4.  Was a 4 at last OFV on 11/20.  Previously was a 7. -Continue Celexa 40 mg daily.  Pt does not wish to increase dose at this time as she feels like she is improving. -will continue to monitor progress.  If needed will discuss dose titration. -Follow-up in 1 month, sooner if needed.   Grier Mitts, MD

## 2017-04-03 NOTE — Patient Instructions (Addendum)

## 2017-05-04 ENCOUNTER — Encounter: Payer: Self-pay | Admitting: Family Medicine

## 2017-05-04 ENCOUNTER — Ambulatory Visit (INDEPENDENT_AMBULATORY_CARE_PROVIDER_SITE_OTHER): Payer: 59 | Admitting: Family Medicine

## 2017-05-04 VITALS — BP 120/80 | HR 73 | Temp 97.7°F | Wt 181.5 lb

## 2017-05-04 DIAGNOSIS — F419 Anxiety disorder, unspecified: Secondary | ICD-10-CM

## 2017-05-04 DIAGNOSIS — F329 Major depressive disorder, single episode, unspecified: Secondary | ICD-10-CM | POA: Diagnosis not present

## 2017-05-04 DIAGNOSIS — F32A Depression, unspecified: Secondary | ICD-10-CM

## 2017-05-04 NOTE — Patient Instructions (Addendum)
Social Anxiety Disorder, Adult  Social anxiety disorder, previously called social phobia, is a mental disorder. People with social anxiety disorder often feel nervous, afraid, or embarrassed when they are around other people in social situations. They worry that other people are judging or criticizing them for how they look, what they say, or how they act.  Social anxiety disorder is more than just occasional shyness or self-consciousness. It can cause severe emotional distress. It can interfere with daily life activities. Social anxiety disorder also may lead to alcohol or drug use and even suicide.  Social anxiety disorder is a common mental disorder. It can develop at any time, but it usually starts in the teenage years.  What are the causes?  The cause of this condition is not known. It may involve genes that are passed through families. Stressful events may trigger anxiety.  What increases the risk?  This condition is more likely to develop in:   People who have a family history of anxiety disorders.   Women.   People who have a condition that makes them feel self-conscious or nervous, such as a stutter or a chronic disease.    What are the signs or symptoms?  The main symptom of this condition is fear of being criticized or judged in social situations. You may be afraid to:   Speak in public.   Go shopping.   Use a public bathroom.   Eat at a restaurant.   Go to work.   Interact with unfamiliar people.    Extreme fear and anxiety may cause physical symptoms, including:   Blushing.   Racing heart.   Sweating.   Shaky hands or voice.   Confusion.   Light-headedness.   Upset stomach, diarrhea, or vomiting.   Shortness of breath.    How is this diagnosed?  Your health care provider can diagnose this condition based on your history, symptoms, and behavior in social situations. Your health care provider may ask you about your use of alcohol or drugs, including prescription medicines. Your health  care provider may refer you to a mental health specialist for further evaluation or treatment.  How is this treated?  Treatment for this condition may include:   Cognitive behavioral therapy. This type of talk therapy helps you learn to replace negative thoughts and behaviors with positive ones. This may include learning better coping skills and ways to control anxiety.   Exposure therapy. You will be exposed to social situations that cause fear. The treatment starts with situations that you can manage. Over time, you will learn to manage harder situations.   Antidepressant medicines. These medicines may be used for a short time along with other therapies.   Beta blockers. These medicines may help to control anxiety.   Biofeedback. This process trains you to manage your body's response (physiological response) through breathing techniques and relaxation methods. You will work with a therapist while machines are used to monitor your physical symptoms.   Relaxation and coping techniques. These include deep breathing, self-talk, meditation, visual imagery, and yoga. Relaxation techniques help to keep you calm in social situations.    These treatments are often used in combination.  Follow these instructions at home:   Take over-the-counter and prescription medicines only as told by your health care provider.   Practice relaxation and coping strategies as taught by your health care provider.   Return to social activities as suggested by your health care provider.   Keep all follow-up visits as told   by your health care provider. This is important.  Contact a health care provider if:   Your symptoms do not improve.   Your symptoms get worse.   You have signs of depression, such as:  ? A persistently sad, cranky, or irritable mood.  ? Loss of enjoyment in activities that used to bring you joy.  ? Change in weight or eating.  ? Changes in sleeping habits.  ? Avoiding friends or family members.  ? Loss of energy  for normal tasks.  ? Feelings of guilt or worthlessness.   You become very isolated.   You find it very hard to speak or interact with others.   You are using drugs.   You are drinking more alcohol than normal.  Get help right away if:   You self-harm.   You have suicidal thoughts.  If you ever feel like you may hurt yourself or others, or have thoughts about taking your own life, get help right away. You can go to your nearest emergency department or call:   Your local emergency services (911 in the [place]   A suicide crisis helpline, such as the National Suicide Prevention Lifeline at 1-800-273-8255. This is open 24 hours a day.    Summary   Social anxiety disorder may cause you to feel nervous, afraid, or embarrassed when you are around other people in social situations.   Social anxiety disorder is a common mental disorder. It can develop at any time, but it usually starts in the teenage years.   Treatment includes talk therapy, exposure therapy, medicines, biofeedback, relaxation techniques, or a combination of two or more treatments.  This information is not intended to replace advice given to you by your health care provider. Make sure you discuss any questions you have with your health care provider.  Document Released: 02/27/2005 Document Revised: 02/22/2016 Document Reviewed: 02/22/2016  Elsevier Interactive Patient Education  2018 [employer] Inc[employer]

## 2017-05-04 NOTE — Progress Notes (Signed)
Subjective:    Patient ID: Sherry [name], female    DOB: 1981-02-21, 37 y.o.   MRN: [id number]  Chief Complaint  Patient presents with  . Follow-up    1 month follow up     HPI Patient was seen today for f/u on depression/anxiety.  Pt has been taking celexa 40 mg daily.  Pt endorses feeling better.  She still has social anxiety.  She does not talk to her co-workers for fear they will talk about her or gossip. Pt has also noticed having more energy at night when she wants to sleep.  Pt does not stay up all night but when she gets off at midnight she may stay up and clean the house after taking a shower.  Pt still endorses stress related to her finances.  She is still looking for a new job.   Pt has not found a counselor yet.  She states when she was a teen she went to [facility] for counseling.  At that time she had SI and was sent to a treatment facility for 1 wk.  Past Medical History:  Diagnosis Date  . Anxiety   . Depression   . GERD (gastroesophageal reflux disease)    with pregnancy  . H/O chlamydia infection   . Severe anemia 06/06/2015  . Shortness of breath dyspnea    with exertion    No Known Allergies  ROS General: Denies fever, chills, night sweats, changes in weight, changes in appetite HEENT: Denies headaches, ear pain, changes in vision, rhinorrhea, sore throat CV: Denies CP, palpitations, SOB, orthopnea Pulm: Denies SOB, cough, wheezing GI: Denies abdominal pain, nausea, vomiting, diarrhea, constipation GU: Denies dysuria, hematuria, frequency, vaginal discharge Msk: Denies muscle cramps, joint pains Neuro: Denies weakness, numbness, tingling Skin: Denies rashes, bruising Psych: Denies hallucinations, SI/HI   +depression, anxiety     Objective:    Blood pressure 120/80, pulse 73, temperature 97.7 F (36.5 C), temperature source Oral, weight 181 lb 8 oz (82.3 kg).   Gen. Pleasant, well-nourished, in no distress, normal affect   HEENT: [place]/AT, face symmetric,  no scleral icterus, PERRLA, nares patent without drainage Lungs: no accessory muscle use, CTAB, no wheezes or rales Cardiovascular: RRR, no m/r/g, no peripheral edema Neuro:  A&Ox3, normal gait    Wt Readings from Last 3 Encounters:  05/04/17 181 lb 8 oz (82.3 kg)  04/03/17 182 lb 4.8 oz (82.7 kg)  03/03/17 181 lb 11.2 oz (82.4 kg)    Lab Results  Component Value Date   WBC 8.4 09/25/2016   HGB 13.2 09/25/2016   HCT 39.7 09/25/2016   PLT 299 09/25/2016   GLUCOSE 79 09/25/2016   CHOL 165 09/16/2012   TRIG 133.0 09/16/2012   HDL 49.30 09/16/2012   LDLCALC 89 09/16/2012   ALT 9 09/25/2016   AST 19 09/25/2016   NA 142 09/25/2016   K 4.3 09/25/2016   CL 104 09/25/2016   CREATININE 0.85 09/25/2016   BUN 8 09/25/2016   CO2 25 09/25/2016   TSH 1.450 09/25/2016   INR 1.22 06/06/2015   HGBA1C 5.3 09/25/2016    Assessment/Plan:  Anxiety and depression -PHQ 9 score 4.  Same as previous OFV on 04/03/17. -Pt encouraged to re-start counseling. -Will try taking celexa at night to see if pt is able to sleep. -Discussed various options including changing to another SSRI if pt continues to have inability to sleep or increased anxiety. -f/u in 1 month.   Grier Mitts, MD

## 2017-06-04 ENCOUNTER — Encounter: Payer: Self-pay | Admitting: Family Medicine

## 2017-06-04 ENCOUNTER — Ambulatory Visit: Payer: 59 | Admitting: Family Medicine

## 2017-06-04 VITALS — BP 118/80 | HR 84 | Temp 97.8°F | Wt 185.0 lb

## 2017-06-04 DIAGNOSIS — F329 Major depressive disorder, single episode, unspecified: Secondary | ICD-10-CM

## 2017-06-04 DIAGNOSIS — F419 Anxiety disorder, unspecified: Secondary | ICD-10-CM | POA: Diagnosis not present

## 2017-06-04 DIAGNOSIS — F32A Depression, unspecified: Secondary | ICD-10-CM

## 2017-06-04 DIAGNOSIS — M79671 Pain in right foot: Secondary | ICD-10-CM | POA: Diagnosis not present

## 2017-06-04 NOTE — Progress Notes (Signed)
Subjective:    Patient ID: Sherry [name], female    DOB: [birth year], 37 y.o.   MRN: [id number]  No chief complaint on file.   HPI Patient was seen today for f/u.  Pt states she has been feeling better.  She is taking celexa 40 mg daily.  Pt endorses increased energy, good sleep, increased interest in things she enjoys.  Pt states she has even noticed more energy at work.  She also started taking a MVI.  Pt mentions soreness in her R foot.  Pt states she was wearing a leather work shoe to work but recently changed to more of an athletic style work shoe.  Pt endorse pain in arch and possible bunion formation.  Pt thinks she inverts her foot when walking.  Pt denies ankle pain.  Past Medical History:  Diagnosis Date  . Anxiety   . Depression   . GERD (gastroesophageal reflux disease)    with pregnancy  . H/O chlamydia infection   . Severe anemia 06/06/2015  . Shortness of breath dyspnea    with exertion    No Known Allergies  ROS General: Denies fever, chills, night sweats, changes in weight, changes in appetite HEENT: Denies headaches, ear pain, changes in vision, rhinorrhea, sore throat CV: Denies CP, palpitations, SOB, orthopnea Pulm: Denies SOB, cough, wheezing GI: Denies abdominal pain, nausea, vomiting, diarrhea, constipation GU: Denies dysuria, hematuria, frequency, vaginal discharge  Msk: Denies muscle cramps, joint pains   +R foot pain Neuro: Denies weakness, numbness, tingling Skin: Denies rashes, bruising Psych: Denies hallucinations  +depression, anxiety     Objective:    Blood pressure 118/80, pulse 84, temperature 97.8 F (36.6 C), temperature source Oral, weight 185 lb (83.9 kg), SpO2 98 %.   Gen. Pleasant, well-nourished, in no distress, normal affect  HEENT: [place]/AT, face symmetric, no scleral icterus, PERRLA, nares patent without drainage Lungs: no accessory muscle use, CTAB, no wheezes or rales Cardiovascular: RRR, no m/r/g, no peripheral  edema Musculoskeletal: No deformities, no cyanosis or clubbing, normal tone.  R foot without deformity, no TTP. Neuro:  A&Ox3, CN II-XII intact, normal gait   Wt Readings from Last 3 Encounters:  06/04/17 185 lb (83.9 kg)  05/04/17 181 lb 8 oz (82.3 kg)  04/03/17 182 lb 4.8 oz (82.7 kg)    Lab Results  Component Value Date   WBC 8.4 09/25/2016   HGB 13.2 09/25/2016   HCT 39.7 09/25/2016   PLT 299 09/25/2016   GLUCOSE 79 09/25/2016   CHOL 165 09/16/2012   TRIG 133.0 09/16/2012   HDL 49.30 09/16/2012   LDLCALC 89 09/16/2012   ALT 9 09/25/2016   AST 19 09/25/2016   NA 142 09/25/2016   K 4.3 09/25/2016   CL 104 09/25/2016   CREATININE 0.85 09/25/2016   BUN 8 09/25/2016   CO2 25 09/25/2016   TSH 1.450 09/25/2016   INR 1.22 06/06/2015   HGBA1C 5.3 09/25/2016    Assessment/Plan:  Anxiety and depression -PHQ 9 score 1.  Was 4 on 05/04/17 -Gad 7 score 5 -continue celexa 40 mg daily -continue to reassess at each [place] -pt again encouraged to start counseling. -as stable, can f/u in 2 months, sooner if needed.  Right foot pain -discussed supportive shoes and insoles -encouraged to wear shoes with adequate room in the toe box. -Will consider referral to sports med for custom inserts if pain continues despite above modifications.  F/u in the next 2 months, sooner if needed.  Grier Mitts, MD

## 2017-06-18 MED FILL — CITALOPRAM HBR 40 MG TABLET: 40 | 90 days supply | Qty: 90 | Fill #1

## 2017-08-03 ENCOUNTER — Ambulatory Visit: Payer: 59 | Admitting: Family Medicine

## 2017-08-10 ENCOUNTER — Encounter: Payer: Self-pay | Admitting: Family Medicine

## 2017-08-10 ENCOUNTER — Ambulatory Visit (INDEPENDENT_AMBULATORY_CARE_PROVIDER_SITE_OTHER): Payer: 59 | Admitting: Family Medicine

## 2017-08-10 VITALS — BP 118/70 | HR 83 | Temp 98.0°F | Wt 186.4 lb

## 2017-08-10 DIAGNOSIS — F32A Depression, unspecified: Secondary | ICD-10-CM

## 2017-08-10 DIAGNOSIS — F419 Anxiety disorder, unspecified: Secondary | ICD-10-CM

## 2017-08-10 DIAGNOSIS — M79671 Pain in right foot: Secondary | ICD-10-CM | POA: Diagnosis not present

## 2017-08-10 DIAGNOSIS — F329 Major depressive disorder, single episode, unspecified: Secondary | ICD-10-CM

## 2017-08-10 DIAGNOSIS — M25512 Pain in left shoulder: Secondary | ICD-10-CM

## 2017-08-10 NOTE — Patient Instructions (Addendum)
Foot Pain Many things can cause foot pain. Some common causes are:  An injury.  A sprain.  Arthritis.  Blisters.  Bunions.  Follow these instructions at home: Pay attention to any changes in your symptoms. Take these actions to help with your discomfort:  If directed, put ice on the affected area: ? Put ice in a plastic bag. ? Place a towel between your skin and the bag. ? Leave the ice on for 15-20 minutes, 3?4 times a day for 2 days.  Take over-the-counter and prescription medicines only as told by your health care provider.  Wear comfortable, supportive shoes that fit you well. Do not wear high heels.  Do not stand or walk for long periods of time.  Do not lift a lot of weight. This can put added pressure on your feet.  Do stretches to relieve foot pain and stiffness as told by your health care provider.  Rub your foot gently.  Keep your feet clean and dry.  Contact a health care provider if:  Your pain does not get better after a few days of self-care.  Your pain gets worse.  You cannot stand on your foot. Get help right away if:  Your foot is numb or tingling.  Your foot or toes are swollen.  Your foot or toes turn white or blue.  You have warmth and redness along your foot. This information is not intended to replace advice given to you by your health care provider. Make sure you discuss any questions you have with your health care provider. Document Released: 04/27/2015 Document Revised: 09/06/2015 Document Reviewed: 04/26/2014 Elsevier Interactive Patient Education  2018 [place] With Depression Everyone experiences occasional disappointment, sadness, and loss in their lives. When you are feeling down, blue, or sad for at least 2 weeks in a row, it may mean that you have depression. Depression can affect your thoughts and feelings, relationships, daily activities, and physical health. It is caused by changes in the way your brain  functions. If you receive a diagnosis of depression, your health care provider will tell you which type of depression you have and what treatment options are available to you. If you are living with depression, there are ways to help you recover from it and also ways to prevent it from coming back. How to cope with lifestyle changes Coping with stress Stress is your body's reaction to life changes and events, both good and bad. Stressful situations may include:  Getting married.  The death of a spouse.  Losing a job.  Retiring.  Having a baby.  Stress can last just a few hours or it can be ongoing. Stress can play a major role in depression, so it is important to learn both how to cope with stress and how to think about it differently. Talk with your health care provider or a counselor if you would like to learn more about stress reduction. He or she may suggest some stress reduction techniques, such as:  Music therapy. This can include creating music or listening to music. Choose music that you enjoy and that inspires you.  Mindfulness-based meditation. This kind of meditation can be done while sitting or walking. It involves being aware of your normal breaths, rather than trying to control your breathing.  Centering prayer. This is a kind of meditation that involves focusing on a spiritual word or phrase. Choose a word, phrase, or sacred image that is meaningful to you and that brings you peace.  Deep breathing. To do this, expand your stomach and inhale slowly through your nose. Hold your breath for 3-5 seconds, then exhale slowly, allowing your stomach muscles to relax.  Muscle relaxation. This involves intentionally tensing muscles then relaxing them.  Choose a stress reduction technique that fits your lifestyle and personality. Stress reduction techniques take time and practice to develop. Set aside 5-15 minutes a day to do them. Therapists can offer training in these techniques.  The training may be covered by some insurance plans. Other things you can do to manage stress include:  Keeping a stress diary. This can help you learn what triggers your stress and ways to control your response.  Understanding what your limits are and saying no to requests or events that lead to a schedule that is too full.  Thinking about how you respond to certain situations. You may not be able to control everything, but you can control how you react.  Adding humor to your life by watching funny films or TV shows.  Making time for activities that help you relax and not feeling guilty about spending your time this way.  Medicines Your health care provider may suggest certain medicines if he or she feels that they will help improve your condition. Avoid using alcohol and other substances that may prevent your medicines from working properly (may interact). It is also important to:  Talk with your pharmacist or health care provider about all the medicines that you take, their possible side effects, and what medicines are safe to take together.  Make it your goal to take part in all treatment decisions (shared decision-making). This includes giving input on the side effects of medicines. It is best if shared decision-making with your health care provider is part of your total treatment plan.  If your health care provider prescribes a medicine, you may not notice the full benefits of it for 4-8 weeks. Most people who are treated for depression need to be on medicine for at least 6-12 months after they feel better. If you are taking medicines as part of your treatment, do not stop taking medicines without first talking to your health care provider. You may need to have the medicine slowly decreased (tapered) over time to decrease the risk of harmful side effects. Relationships Your health care provider may suggest family therapy along with individual therapy and drug therapy. While there may not  be family problems that are causing you to feel depressed, it is still important to make sure your family learns as much as they can about your mental health. Having your family's support can help make your treatment successful. How to recognize changes in your condition Everyone has a different response to treatment for depression. Recovery from major depression happens when you have not had signs of major depression for two months. This may mean that you will start to:  Have more interest in doing activities.  Feel less hopeless than you did 2 months ago.  Have more energy.  Overeat less often, or have better or improving appetite.  Have better concentration.  Your health care provider will work with you to decide the next steps in your recovery. It is also important to recognize when your condition is getting worse. Watch for these signs:  Having fatigue or low energy.  Eating too much or too little.  Sleeping too much or too little.  Feeling restless, agitated, or hopeless.  Having trouble concentrating or making decisions.  Having unexplained physical complaints.  Feeling irritable, angry, or aggressive.  Get help as soon as you or your family members notice these symptoms coming back. How to get support and help from others How to talk with friends and family members about your condition Talking to friends and family members about your condition can provide you with one way to get support and guidance. Reach out to trusted friends or family members, explain your symptoms to them, and let them know that you are working with a health care provider to treat your depression. Financial resources Not all insurance plans cover mental health care, so it is important to check with your insurance carrier. If paying for co-pays or counseling services is a problem, search for a local or county mental health care center. They may be able to offer public mental health care services at low  or no cost when you are not able to see a private health care provider. If you are taking medicine for depression, you may be able to get the generic form, which may be less expensive. Some makers of prescription medicines also offer help to patients who cannot afford the medicines they need. Follow these instructions at home:  Get the right amount and quality of sleep.  Cut down on using caffeine, tobacco, alcohol, and other potentially harmful substances.  Try to exercise, such as walking or lifting small weights.  Take over-the-counter and prescription medicines only as told by your health care provider.  Eat a healthy diet that includes plenty of vegetables, fruits, whole grains, low-fat dairy products, and lean protein. Do not eat a lot of foods that are high in solid fats, added sugars, or salt.  Keep all follow-up visits as told by your health care provider. This is important. Contact a health care provider if:  You stop taking your antidepressant medicines, and you have any of these symptoms: ? Nausea. ? Headache. ? Feeling lightheaded. ? Chills and body aches. ? Not being able to sleep (insomnia).  You or your friends and family think your depression is getting worse. Get help right away if:  You have thoughts of hurting yourself or others. If you ever feel like you may hurt yourself or others, or have thoughts about taking your own life, get help right away. You can go to your nearest emergency department or call:  Your local emergency services (911 in the [place]  A suicide crisis helpline, such as the [place] at 463-270-9245. This is open 24-hours a day.  Summary  If you are living with depression, there are ways to help you recover from it and also ways to prevent it from coming back.  Work with your health care team to create a management plan that includes counseling, stress management techniques, and healthy lifestyle habits. This  information is not intended to replace advice given to you by your health care provider. Make sure you discuss any questions you have with your health care provider. Document Released: 03/03/2016 Document Revised: 03/03/2016 Document Reviewed: 03/03/2016 Elsevier Interactive Patient Education  2018 [place].  Shoulder Pain Many things can cause shoulder pain, including:  An injury.  Moving the arm in the same way again and again (overuse).  Joint pain (arthritis).  Follow these instructions at home: Take these actions to help with your pain:  Squeeze a soft ball or a foam pad as much as you can. This helps to prevent swelling. It also makes the arm stronger.  Take over-the-counter and prescription medicines only as  told by your doctor.  If told, put ice on the area: ? Put ice in a plastic bag. ? Place a towel between your skin and the bag. ? Leave the ice on for 20 minutes, 2-3 times per day. Stop putting on ice if it does not help with the pain.  If you were given a shoulder sling or immobilizer: ? Wear it as told. ? Remove it to shower or bathe. ? Move your arm as little as possible. ? Keep your hand moving. This helps prevent swelling.  Contact a doctor if:  Your pain gets worse.  Medicine does not help your pain.  You have new pain in your arm, hand, or fingers. Get help right away if:  Your arm, hand, or fingers: ? Tingle. ? Are numb. ? Are swollen. ? Are painful. ? Turn white or blue. This information is not intended to replace advice given to you by your health care provider. Make sure you discuss any questions you have with your health care provider. Document Released: 09/17/2007 Document Revised: 11/25/2015 Document Reviewed: 07/24/2014 Elsevier Interactive Patient Education  [employer].

## 2017-08-10 NOTE — Progress Notes (Signed)
Subjective:    Patient ID: Sherry [name], female    DOB: November 28, 1980, 37 y.o.   MRN: [id number]  Chief Complaint  Patient presents with  . Follow-up    HPI Patient was seen today for f/u on anxiety and depression.  Pt is taking celexa 40 mg daily.  She reports improvement in her mood and sleep.  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  Pt states she is still having R foot pain with prolonged standing.  Pt also notes cramping in midfoot and R great toe.  The pain improves with sitting.  Pt states she wears a soft sketchers sneaker for work.  Pt denies the shoe being too tight.  Pt has been putting icy hot type cream on her foot.  Pt also endorses L shoulder pain.  She states she noticed shoulder pain about 2 weeks ago.  Patient endorses lifting heavy laundry bags which likely caused her pain.  Patient is also been using icy hot on her shoulder.  Past Medical History:  Diagnosis Date  . Anxiety   . Depression   . GERD (gastroesophageal reflux disease)    with pregnancy  . H/O chlamydia infection   . Severe anemia 06/06/2015  . Shortness of breath dyspnea    with exertion    No Known Allergies  ROS General: Denies fever, chills, night sweats, changes in weight, changes in appetite HEENT: Denies headaches, ear pain, changes in vision, rhinorrhea, sore throat CV: Denies CP, palpitations, SOB, orthopnea Pulm: Denies SOB, cough, wheezing GI: Denies abdominal pain, nausea, vomiting, diarrhea, constipation GU: Denies dysuria, hematuria, frequency, vaginal discharge Msk: Denies muscle cramps, joint pains  + right foot pain and left shoulder pain Neuro: Denies weakness, numbness, tingling Skin: Denies rashes, bruising Psych: Denies hallucinations  + depression and anxiety     Objective:    Blood pressure 118/70, pulse 83, temperature 98 F (36.7 C), temperature source Oral, weight 186 lb 6.4 oz (84.6 kg), SpO2 98 %.   Gen. Pleasant,  well-nourished, in no distress, normal affect  HEENT: [place]/AT, face symmetric, conjunctiva clear, no scleral icterus, PERRLA, EOMI, nares patent without drainage Lungs: no accessory muscle use, CTAB, no wheezes or rales Cardiovascular: RRR, no m/r/g, no peripheral edema Musculoskeletal: No deformities, no cyanosis or clubbing, normal tone TTP of right medial foot, MTP joint, and arch.  Right foot pain with eversion.  Right great toe callus.  L shoulder with FROM, TTP of lateral clavicle and anterior humeral head. Neuro:  A&Ox3, CN II-XII intact, normal gait Skin:  Warm, no lesions/ rash   Wt Readings from Last 3 Encounters:  08/10/17 186 lb 6.4 oz (84.6 kg)  06/04/17 185 lb (83.9 kg)  05/04/17 181 lb 8 oz (82.3 kg)    Lab Results  Component Value Date   WBC 8.4 09/25/2016   HGB 13.2 09/25/2016   HCT 39.7 09/25/2016   PLT 299 09/25/2016   GLUCOSE 79 09/25/2016   CHOL 165 09/16/2012   TRIG 133.0 09/16/2012   HDL 49.30 09/16/2012   LDLCALC 89 09/16/2012   ALT 9 09/25/2016   AST 19 09/25/2016   NA 142 09/25/2016   K 4.3 09/25/2016   CL 104 09/25/2016   CREATININE 0.85 09/25/2016   BUN 8 09/25/2016   CO2 25 09/25/2016   TSH 1.450 09/25/2016   INR 1.22 06/06/2015   HGBA1C 5.3 09/25/2016    Assessment/Plan:  Right foot pain -discussed wearing insoles with arch  support -continue Tylenol or Ibuprofen prn -discussed stretching  - Plan: Ambulatory referral to Podiatry  Anxiety and depression -PHQ 9 score 3.  Was seen 1 on 06/04/2017 -Gad 7 score 9.  Was 51/21/19 -Continue Celexa 40 mg daily -pt encouraged to start counseling -f/u in the next few months  Acute pain of left shoulder -continue rest, heat, NSAIDs  F/u in the next few months for CPE.  Grier Mitts, MD

## 2017-08-19 ENCOUNTER — Encounter: Payer: 59 | Admitting: Podiatry

## 2017-08-19 DIAGNOSIS — F53 Postpartum depression: Secondary | ICD-10-CM | POA: Insufficient documentation

## 2017-08-19 DIAGNOSIS — N911 Secondary amenorrhea: Secondary | ICD-10-CM | POA: Insufficient documentation

## 2017-08-19 DIAGNOSIS — O99345 Other mental disorders complicating the puerperium: Secondary | ICD-10-CM

## 2017-08-19 HISTORY — DX: Secondary amenorrhea: N91.1

## 2017-08-19 NOTE — Patient Instructions (Signed)

## 2017-08-28 ENCOUNTER — Encounter: Payer: Self-pay | Admitting: Podiatry

## 2017-08-28 ENCOUNTER — Other Ambulatory Visit: Payer: Self-pay | Admitting: Podiatry

## 2017-08-28 ENCOUNTER — Ambulatory Visit: Payer: 59 | Admitting: Podiatry

## 2017-08-28 ENCOUNTER — Ambulatory Visit (INDEPENDENT_AMBULATORY_CARE_PROVIDER_SITE_OTHER): Payer: 59

## 2017-08-28 DIAGNOSIS — M21611 Bunion of right foot: Secondary | ICD-10-CM | POA: Diagnosis not present

## 2017-08-28 DIAGNOSIS — M2011 Hallux valgus (acquired), right foot: Secondary | ICD-10-CM

## 2017-08-28 DIAGNOSIS — M2012 Hallux valgus (acquired), left foot: Secondary | ICD-10-CM | POA: Diagnosis not present

## 2017-08-28 DIAGNOSIS — M216X9 Other acquired deformities of unspecified foot: Secondary | ICD-10-CM

## 2017-08-28 DIAGNOSIS — D492 Neoplasm of unspecified behavior of bone, soft tissue, and skin: Secondary | ICD-10-CM

## 2017-08-28 NOTE — Progress Notes (Signed)
Subjective:   Patient ID: Sherry [name], female   DOB: 37 y.o.   MRN: [id number]   HPI Patient presents with painful bunion deformity right and severely painful lesion plantar aspect right and mild on the left.  Patient states the bunion is been present for a long time she is tried wider shoes and padding without relief and tries to trim the lesion on the bottom of the foot without success.  Patient does not smoke and likes to be active   Review of Systems  All other systems reviewed and are negative.       Objective:  Physical Exam  Constitutional: She appears well-developed and well-nourished.  Cardiovascular: Intact distal pulses.  Pulmonary/Chest: Effort normal.  Musculoskeletal: Normal range of motion.  Neurological: She is alert.  Skin: Skin is warm.  Nursing note and vitals reviewed.   Neurovascular status found to be intact muscle strength is adequate range of motion within normal limits with patient found to have redness and pain around the first metatarsal right with patient also noted to have severe keratotic lesion sub-fourth metatarsal right and also plantar left foot.  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  She wants surgery for this right foot due to the intensity of discomfort and failure to respond conservatively and she needs to have it done soon due to her work schedule.  At this point I did allow her to go over consent form reviewing with her alternative treatments complications and the surgical procedures to be performed.  Patient is willing to undergo surgical procedure understanding risk and after extensive review  signed consent form for [facility] type osteotomy right along with elevating osteotomy and excision of plantar skin wedge.  I did explain that it is very possible this lesion will recur or transfer and she understands this and she does understands total recovery take 6 months to 1 year.  I dispensed air fracture walker at the current time and encouraged her to call with questions prior to procedure  X-rays indicate that there is elevation of the intermetatarsal angle right and that the lesion is directly on the fourth metatarsal right with plantar flexion of the metatarsal.  Left foot shows mild deformity not to the same degree as right

## 2017-08-28 NOTE — Patient Instructions (Addendum)
Bunion A bunion is a bump on the base of the big toe that forms when the bones of the big toe joint move out of position. Bunions may be small at first, but they often get larger over time. The can make walking painful. What are the causes? A bunion may be caused by:  Wearing narrow or pointed shoes that force the big toe to press against the other toes.  Abnormal foot development that causes the foot to roll inward (pronate).  Changes in the foot that are caused by certain diseases, such as rheumatoid arthritis and polio.  A foot injury.  What increases the risk? The following factors may make you more likely to develop this condition:  Wearing shoes that squeeze the toes together.  Having certain diseases, such as: ? Rheumatoid arthritis. ? Polio. ? Cerebral palsy.  Having family members who have bunions.  Being born with a foot deformity, such as flat feet or low arches.  Doing activities that put a lot of pressure on the feet, such as ballet dancing.  What are the signs or symptoms? The main symptom of a bunion is a noticeable bump on the big toe. Other symptoms may include:  Pain.  Swelling around the big toe.  Redness and inflammation.  Thick or hardened skin on the big toe or between the toes.  Stiffness or loss of motion in the big toe.  Trouble with walking.  How is this diagnosed? A bunion may be diagnosed based on your symptoms, medical history, and activities. You may have tests, such as:  X-rays. These allow your health care provider to check the position of the bones in your foot and look for damage to your joint. They also help your health care provider to determine the severity of your bunion and the best way to treat it.  Joint aspiration. In this test, a sample of fluid is removed from the toe joint. This test, which may be done if you are in a lot of pain, helps to rule out diseases that cause painful swelling of the joints, such as  arthritis.  How is this treated? There is no cure for a bunion, but treatment can help to prevent a bunion from getting worse. Treatment depends on the severity of your symptoms. Your health care provider may recommend:  Wearing shoes that have a wide toe box.  Using bunion pads to cushion the affected area.  Taping your toes together to keep them in a normal position.  Placing a device inside your shoe (orthotics) to help reduce pressure on your toe joint.  Taking medicine to ease pain, inflammation, and swelling.  Applying heat or ice to the affected area.  Doing stretching exercises.  Surgery to remove scar tissue and move the toes back into their normal position. This treatment is rare.  Follow these instructions at home:  Support your toe joint with proper footwear, shoe padding, or taping as told by your health care provider.  Take over-the-counter and prescription medicines only as told by your health care provider.  If directed, apply ice to the injured area: ? Put ice in a plastic bag. ? Place a towel between your skin and the bag. ? Leave the ice on for 20 minutes, 2-3 times per day.  If directed, apply heat to the affected area before you exercise. Use the heat source that your health care provider recommends, such as a moist heat pack or a heating pad. ? Place a towel between your   skin and the heat source. ? Leave the heat on for 20-30 minutes. ? Remove the heat if your skin turns bright red. This is especially important if you are unable to feel pain, heat, or cold. You may have a greater risk of getting burned.  Do exercises as told by your health care provider.  Keep all follow-up visits as told by your health care provider. Contact a health care provider if:  Your symptoms get worse.  Your symptoms do not improve in 2 weeks. Get help right away if:  You have severe pain and trouble with walking. This information is not intended to replace advice given  to you by your health care provider. Make sure you discuss any questions you have with your health care provider. Document Released: 03/31/2005 Document Revised: 09/06/2015 Document Reviewed: 10/29/2014 Elsevier Interactive Patient Education  2018 [employer]  Pre-Operative Instructions  Congratulations, you have decided to take an important step towards improving your quality of life.  You can be assured that the doctors and staff at [facility] will be with you every step of the way.  Here are some important things you should know:  1. Plan to be at the surgery center/hospital at least 1 (one) hour prior to your scheduled time, unless otherwise directed by the surgical center/hospital staff.  You must have a responsible adult accompany you, remain during the surgery and drive you home.  Make sure you have directions to the surgical center/hospital to ensure you arrive on time. 2. If you are having surgery at [facility] or [facility], you will need a copy of your medical history and physical form from your family physician within one month prior to the date of surgery. We will give you a form for your primary physician to complete.  3. We make every effort to accommodate the date you request for surgery.  However, there are times where surgery dates or times have to be moved.  We will contact you as soon as possible if a change in schedule is required.   4. No aspirin/ibuprofen for one week before surgery.  If you are on aspirin, any non-steroidal anti-inflammatory medications (Mobic, Aleve, Ibuprofen) should not be taken seven (7) days prior to your surgery.  You make take Tylenol for pain prior to surgery.  5. Medications - If you are taking daily heart and blood pressure medications, seizure, reflux, allergy, asthma, anxiety, pain or diabetes medications, make sure you notify the surgery center/hospital before the day of surgery so they can tell you which medications you should  take or avoid the day of surgery. 6. No food or drink after midnight the night before surgery unless directed otherwise by surgical center/hospital staff. 7. No alcoholic beverages 24-hours prior to surgery.  No smoking 24-hours prior or 24-hours after surgery. 8. Wear loose pants or shorts. They should be loose enough to fit over bandages, boots, and casts. 9. Don't wear slip-on shoes. Sneakers are preferred. 10. Bring your boot with you to the surgery center/hospital.  Also bring crutches or a walker if your physician has prescribed it for you.  If you do not have this equipment, it will be provided for you after surgery. 11. If you have not been contacted by the surgery center/hospital by the day before your surgery, call to confirm the date and time of your surgery. 12. Leave-time from work may vary depending on the type of surgery you have.  Appropriate arrangements should be made prior   to surgery with your employer. 13. Prescriptions will be provided immediately following surgery by your doctor.  Fill these as soon as possible after surgery and take the medication as directed. Pain medications will not be refilled on weekends and must be approved by the doctor. 14. Remove nail polish on the operative foot and avoid getting pedicures prior to surgery. 15. Wash the night before surgery.  The night before surgery wash the foot and leg well with water and the antibacterial soap provided. Be sure to pay special attention to beneath the toenails and in between the toes.  Wash for at least three (3) minutes. Rinse thoroughly with water and dry well with a towel.  Perform this wash unless told not to do so by your physician.  Enclosed: 1 Ice pack (please put in freezer the night before surgery)   1 Hibiclens skin cleaner   Pre-op instructions  If you have any questions regarding the instructions, please do not hesitate to call our office.  [place]: [address], [place], [place] [address] --  [phone]  [place]: [address] [place], [place] [address] -- [phone]  [place]: [address]  [place], [place] [address] -- [phone]  [place]: [address], [address], [place], [place] [address] -- [phone]  Website: [email or web address]  

## 2017-08-28 NOTE — Progress Notes (Signed)
   Subjective:    Patient ID: Sherry [name], female    DOB: 1980-05-04, 37 y.o.   MRN: [id number]  HPI    Review of Systems  All other systems reviewed and are negative.      Objective:   Physical Exam        Assessment & Plan:

## 2017-09-02 ENCOUNTER — Encounter: Payer: Self-pay | Admitting: Podiatry

## 2017-09-02 ENCOUNTER — Telehealth: Payer: Self-pay | Admitting: *Deleted

## 2017-09-02 NOTE — Telephone Encounter (Signed)
"  I'm calling to schedule my surgery with Dr. Paulla Dolly."  I thought we already had you scheduled.  "Well, I had said I wanted to do it after [facility] and they scheduled me for May 28.  I can't do it then.  I want to know if he can do it on June 11?"  Yes, he can do it on June 11, 20119.  "Okay, what time do I need to be there?"  Someone from the surgical center will call you the Friday or Monday prior to your surgery date and they will give you your arrival time.

## 2017-09-02 NOTE — Progress Notes (Signed)
This encounter was created in error - please disregard.

## 2017-09-22 ENCOUNTER — Encounter: Payer: Self-pay | Admitting: Podiatry

## 2017-09-22 DIAGNOSIS — D2371 Other benign neoplasm of skin of right lower limb, including hip: Secondary | ICD-10-CM | POA: Diagnosis not present

## 2017-09-22 DIAGNOSIS — M21549 Acquired clubfoot, unspecified foot: Secondary | ICD-10-CM

## 2017-09-22 DIAGNOSIS — M21611 Bunion of right foot: Secondary | ICD-10-CM | POA: Diagnosis not present

## 2017-09-22 DIAGNOSIS — M21541 Acquired clubfoot, right foot: Secondary | ICD-10-CM | POA: Diagnosis not present

## 2017-09-22 DIAGNOSIS — D649 Anemia, unspecified: Secondary | ICD-10-CM | POA: Diagnosis not present

## 2017-09-22 DIAGNOSIS — M2011 Hallux valgus (acquired), right foot: Secondary | ICD-10-CM

## 2017-09-22 DIAGNOSIS — M2041 Other hammer toe(s) (acquired), right foot: Secondary | ICD-10-CM | POA: Diagnosis not present

## 2017-09-22 DIAGNOSIS — D492 Neoplasm of unspecified behavior of bone, soft tissue, and skin: Secondary | ICD-10-CM

## 2017-09-22 DIAGNOSIS — M216X1 Other acquired deformities of right foot: Secondary | ICD-10-CM | POA: Diagnosis not present

## 2017-09-22 MED FILL — OXYCODONE-ACETAMINOPHEN 10-: 10-325 | 4 days supply | Qty: 25 | Fill #0

## 2017-09-22 MED FILL — ONDANSETRON HCL 4 MG TABLET: 4 | 7 days supply | Qty: 20 | Fill #0

## 2017-09-30 ENCOUNTER — Ambulatory Visit (INDEPENDENT_AMBULATORY_CARE_PROVIDER_SITE_OTHER): Payer: 59 | Admitting: Podiatry

## 2017-09-30 ENCOUNTER — Ambulatory Visit (INDEPENDENT_AMBULATORY_CARE_PROVIDER_SITE_OTHER): Payer: 59

## 2017-09-30 ENCOUNTER — Encounter: Payer: Self-pay | Admitting: Podiatry

## 2017-09-30 VITALS — BP 142/94 | HR 91 | Temp 96.7°F

## 2017-09-30 DIAGNOSIS — M21611 Bunion of right foot: Secondary | ICD-10-CM

## 2017-09-30 DIAGNOSIS — M2012 Hallux valgus (acquired), left foot: Secondary | ICD-10-CM

## 2017-09-30 NOTE — Progress Notes (Signed)
Subjective:   Patient ID: Sherry [name], female   DOB: 37 y.o.   MRN: [id number]   HPI Patient states right foot is doing well and patient has just been taking ibuprofen as needed   ROS      Objective:  Physical Exam  Neurovascular status intact with patient's right foot doing well with wound edges well coapted hallux in rectus position fourth metatarsal in good alignment     Assessment:  Doing well post foot surgery right     Plan:  H&P x-ray reviewed with patient and advised to continue immobilization elevation compression and sterile dressing reapplied.  Reappoint 2 weeks for suture removal or earlier if needed with negative Homans sign noted  X-ray indicates osteotomies are healing well good alignment noted with good reduction of intermetatarsal angle

## 2017-10-05 ENCOUNTER — Ambulatory Visit (INDEPENDENT_AMBULATORY_CARE_PROVIDER_SITE_OTHER): Payer: 59 | Admitting: Family Medicine

## 2017-10-05 ENCOUNTER — Encounter: Payer: Self-pay | Admitting: Family Medicine

## 2017-10-05 VITALS — BP 118/82 | HR 84 | Temp 97.8°F | Ht 65.0 in | Wt 186.0 lb

## 2017-10-05 DIAGNOSIS — Z131 Encounter for screening for diabetes mellitus: Secondary | ICD-10-CM | POA: Diagnosis not present

## 2017-10-05 DIAGNOSIS — Z13220 Encounter for screening for lipoid disorders: Secondary | ICD-10-CM

## 2017-10-05 DIAGNOSIS — Z0000 Encounter for general adult medical examination without abnormal findings: Secondary | ICD-10-CM

## 2017-10-05 LAB — CBC WITH DIFFERENTIAL/PLATELET
BASOS ABS: 0 10*3/uL (ref 0.0–0.1)
Basophils Relative: 0.3 % (ref 0.0–3.0)
EOS ABS: 0.1 10*3/uL (ref 0.0–0.7)
Eosinophils Relative: 1.5 % (ref 0.0–5.0)
HCT: 40.2 % (ref 36.0–46.0)
Hemoglobin: 13.6 g/dL (ref 12.0–15.0)
LYMPHS ABS: 2.4 10*3/uL (ref 0.7–4.0)
LYMPHS PCT: 24.7 % (ref 12.0–46.0)
MCHC: 33.9 g/dL (ref 30.0–36.0)
MCV: 88.9 fl (ref 78.0–100.0)
MONOS PCT: 5.3 % (ref 3.0–12.0)
Monocytes Absolute: 0.5 10*3/uL (ref 0.1–1.0)
Neutro Abs: 6.6 10*3/uL (ref 1.4–7.7)
Neutrophils Relative %: 68.2 % (ref 43.0–77.0)
Platelets: 265 10*3/uL (ref 150.0–400.0)
RBC: 4.52 Mil/uL (ref 3.87–5.11)
RDW: 13.5 % (ref 11.5–15.5)
WBC: 9.7 10*3/uL (ref 4.0–10.5)

## 2017-10-05 LAB — BASIC METABOLIC PANEL
BUN: 12 mg/dL (ref 6–23)
CHLORIDE: 102 meq/L (ref 96–112)
CO2: 28 meq/L (ref 19–32)
CREATININE: 0.82 mg/dL (ref 0.40–1.20)
Calcium: 9.5 mg/dL (ref 8.4–10.5)
GFR: 101.86 mL/min (ref >60.00)
Glucose, Bld: 72 mg/dL (ref 70–99)
POTASSIUM: 3.9 meq/L (ref 3.5–5.1)
Sodium: 139 mEq/L (ref 135–145)

## 2017-10-05 LAB — LIPID PANEL
CHOLESTEROL: 176 mg/dL (ref 0–200)
HDL: 49.1 mg/dL (ref >39.00)
LDL CALC: 107 mg/dL — AB (ref 0–99)
NonHDL: 127.27
TRIGLYCERIDES: 100 mg/dL (ref 0.0–149.0)
Total CHOL/HDL Ratio: 4
VLDL: 20 mg/dL (ref 0.0–40.0)

## 2017-10-05 LAB — HEMOGLOBIN A1C: HEMOGLOBIN A1C: 5.5 % (ref 4.6–6.5)

## 2017-10-05 NOTE — Progress Notes (Signed)
Subjective:     Sherry [name] is a 37 y.o. female and is here for a comprehensive physical exam. The patient reports no problems.  States her mood is good.  Pt is 3 wks s/p R bunionectomy and fourth digit elevation. States she is doing well.  Still has stiches in place.  Is using a cam walker, stopped using crutches over the weekend.  Out of work until Aug 12th.  Social History   Socioeconomic History  . Marital status: Married    Spouse name: Not on file  . Number of children: Not on file  . Years of education: Not on file  . Highest education level: Not on file  Occupational History  . Not on file  Social Needs  . Financial resource strain: Not on file  . Food insecurity:    Worry: Not on file    Inability: Not on file  . Transportation needs:    Medical: Not on file    Non-medical: Not on file  Tobacco Use  . Smoking status: Never Smoker  . Smokeless tobacco: Never Used  Substance and Sexual Activity  . Alcohol use: No  . Drug use: No  . Sexual activity: Never    Partners: Male    Birth control/protection: Surgical  Lifestyle  . Physical activity:    Days per week: Not on file    Minutes per session: Not on file  . Stress: Not on file  Relationships  . Social connections:    Talks on phone: Not on file    Gets together: Not on file    Attends religious service: Not on file    Active member of club or organization: Not on file    Attends meetings of clubs or organizations: Not on file    Relationship status: Not on file  . Intimate partner violence:    Fear of current or ex partner: Not on file    Emotionally abused: Not on file    Physically abused: Not on file    Forced sexual activity: Not on file  Other Topics Concern  . Not on file  Social History Narrative         Health Maintenance  Topic Date Due  . INFLUENZA VACCINE  11/12/2017  . PAP SMEAR  09/26/2019  . TETANUS/TDAP  04/04/2024  . HIV Screening  Completed    The following portions of  the patient's history were reviewed and updated as appropriate: allergies, current medications, past family history, past medical history, past social history, past surgical history and problem list.  Review of Systems A comprehensive review of systems was negative.   Objective:    BP 118/82 (BP Location: Left Arm, Patient Position: Sitting, Cuff Size: Normal)   Pulse 84   Temp 97.8 F (36.6 C) (Oral)   Ht 5\' 5"  (1.651 m)   Wt 186 lb (84.4 kg)   SpO2 97%   BMI 30.95 kg/m  General appearance: alert, cooperative and no distress Head: Normocephalic, without obvious abnormality, atraumatic Eyes: conjunctivae/corneas clear. PERRL, EOM's intact. Fundi benign. Ears: normal TM's and external ear canals both ears Nose: Nares normal. Septum midline. Mucosa normal. No drainage or sinus tenderness. Throat: lips, mucosa, and tongue normal; teeth with carries and gums normal Neck: no adenopathy, no JVD, supple, symmetrical, trachea midline and thyroid not enlarged, symmetric, no tenderness/mass/nodules Lungs: clear to auscultation bilaterally Heart: regular rate and rhythm, S1, S2 normal, no murmur, click, rub or gallop Abdomen: soft, non-tender; bowel sounds normal;  no masses,  no organomegaly Extremities: Cam walker boot on R Foot s/p bunionectomy and 4th toe elevation. Skin: Skin color, texture, turgor normal. No rashes or lesions Neurologic: Alert and oriented X 3, normal strength and tone. Normal symmetric reflexes. Normal coordination and gait    Assessment:    Healthy female exam.      Plan:     Anticipatory guidance given including wearing seatbelts, smoke detectors in the home, increasing physical activity, increasing p.o. intake of water and vegetables. -pap up to date done 2018 -obtain labs -next CPE in 1 yr See After Visit Summary for Counseling Recommendations    Continue to keep f/u with podiatry for R foot s/p bunionectomy and 4th digit elevation.  F/u prn Grier Mitts,  MD

## 2017-10-05 NOTE — Patient Instructions (Signed)
Preventive Care 18-39 Years, Female Preventive care refers to lifestyle choices and visits with your health care provider that can promote health and wellness. What does preventive care include?  A yearly physical exam. This is also called an annual well check.  Dental exams once or twice a year.  Routine eye exams. Ask your health care provider how often you should have your eyes checked.  Personal lifestyle choices, including: ? Daily care of your teeth and gums. ? Regular physical activity. ? Eating a healthy diet. ? Avoiding tobacco and drug use. ? Limiting alcohol use. ? Practicing safe sex. ? Taking vitamin and mineral supplements as recommended by your health care provider. What happens during an annual well check? The services and screenings done by your health care provider during your annual well check will depend on your age, overall health, lifestyle risk factors, and family history of disease. Counseling Your health care provider may ask you questions about your:  Alcohol use.  Tobacco use.  Drug use.  Emotional well-being.  Home and relationship well-being.  Sexual activity.  Eating habits.  Work and work Statistician.  Method of birth control.  Menstrual cycle.  Pregnancy history.  Screening You may have the following tests or measurements:  Height, weight, and BMI.  Diabetes screening. This is done by checking your blood sugar (glucose) after you have not eaten for a while (fasting).  Blood pressure.  Lipid and cholesterol levels. These may be checked every 5 years starting at age 66.  Skin check.  Hepatitis C blood test.  Hepatitis B blood test.  Sexually transmitted disease (STD) testing.  BRCA-related cancer screening. This may be done if you have a family history of breast, ovarian, tubal, or peritoneal cancers.  Pelvic exam and Pap test. This may be done every 3 years starting at age 40. Starting at age 59, this may be done every 5  years if you have a Pap test in combination with an HPV test.  Discuss your test results, treatment options, and if necessary, the need for more tests with your health care provider. Vaccines Your health care provider may recommend certain vaccines, such as:  Influenza vaccine. This is recommended every year.  Tetanus, diphtheria, and acellular pertussis (Tdap, Td) vaccine. You may need a Td booster every 10 years.  Varicella vaccine. You may need this if you have not been vaccinated.  HPV vaccine. If you are 69 or younger, you may need three doses over 6 months.  Measles, mumps, and rubella (MMR) vaccine. You may need at least one dose of MMR. You may also need a second dose.  Pneumococcal 13-valent conjugate (PCV13) vaccine. You may need this if you have certain conditions and were not previously vaccinated.  Pneumococcal polysaccharide (PPSV23) vaccine. You may need one or two doses if you smoke cigarettes or if you have certain conditions.  Meningococcal vaccine. One dose is recommended if you are age 27-21 years and a first-year college student living in a residence hall, or if you have one of several medical conditions. You may also need additional booster doses.  Hepatitis A vaccine. You may need this if you have certain conditions or if you travel or work in places where you may be exposed to hepatitis A.  Hepatitis B vaccine. You may need this if you have certain conditions or if you travel or work in places where you may be exposed to hepatitis B.  Haemophilus influenzae type b (Hib) vaccine. You may need this if  you have certain risk factors.  Talk to your health care provider about which screenings and vaccines you need and how often you need them. This information is not intended to replace advice given to you by your health care provider. Make sure you discuss any questions you have with your health care provider. Document Released: 05/27/2001 Document Revised: 12/19/2015  Document Reviewed: 01/30/2015 Elsevier Interactive Patient Education  [employer].

## 2017-10-12 ENCOUNTER — Ambulatory Visit (INDEPENDENT_AMBULATORY_CARE_PROVIDER_SITE_OTHER): Payer: 59

## 2017-10-12 ENCOUNTER — Ambulatory Visit (INDEPENDENT_AMBULATORY_CARE_PROVIDER_SITE_OTHER): Payer: 59 | Admitting: Podiatry

## 2017-10-12 ENCOUNTER — Other Ambulatory Visit: Payer: Self-pay | Admitting: Podiatry

## 2017-10-12 ENCOUNTER — Encounter: Payer: Self-pay | Admitting: Podiatry

## 2017-10-12 DIAGNOSIS — M21611 Bunion of right foot: Secondary | ICD-10-CM

## 2017-10-12 DIAGNOSIS — M2011 Hallux valgus (acquired), right foot: Secondary | ICD-10-CM

## 2017-10-13 NOTE — Progress Notes (Signed)
Subjective:   Patient ID: Sherry [name], female   DOB: 37 y.o.   MRN: [id number]   HPI Patient states doing real well with surgery on her right foot   ROS      Objective:  Physical Exam  Neurovascular status intact with patient's right foot healing well wound edges well coapted with good alignment noted and stitches in place plantar     Assessment:  Doing well post foot surgery right     Plan:  Stitches are removed wound edges well coapted reapplied sterile dressing advised to continue compression elevation immobilization and reviewed x-ray of the right foot  X-ray indicates that the osteotomies are healing well with good alignment and positional structural components

## 2017-10-14 ENCOUNTER — Encounter: Payer: 59 | Admitting: Podiatry

## 2017-10-25 IMAGING — US US TRANSVAGINAL NON-OB
1 series · 15 of 25 positions shown · non-contrast
Comparison: None

CLINICAL DATA: Dysfunctional uterine bleeding.

EXAM:
TRANSABDOMINAL AND TRANSVAGINAL ULTRASOUND OF PELVIS
TECHNIQUE: Both transabdominal and transvaginal ultrasound examinations of the
pelvis were performed. Transabdominal technique was performed for
global imaging of the pelvis including uterus, ovaries, adnexal
regions, and pelvic cul-de-sac. It was necessary to proceed with
endovaginal exam following the transabdominal exam to visualize the
uterus and ovaries..

[Series 1: us transvaginal non-ob · 15 of 67 slices shown]
[im 1/67]
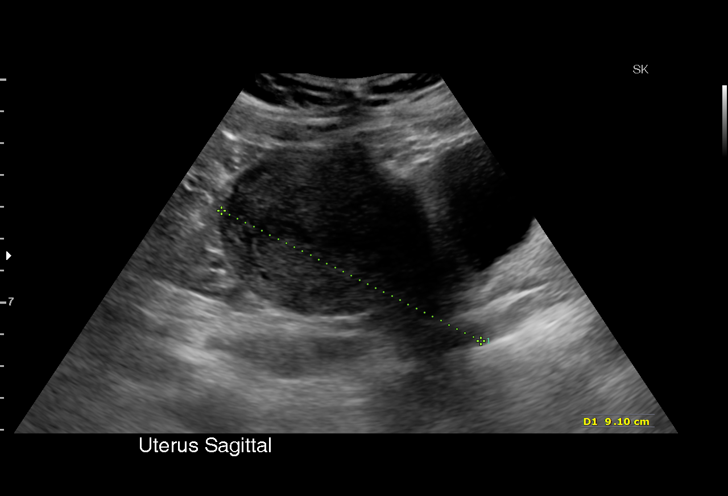
[im 6/67]
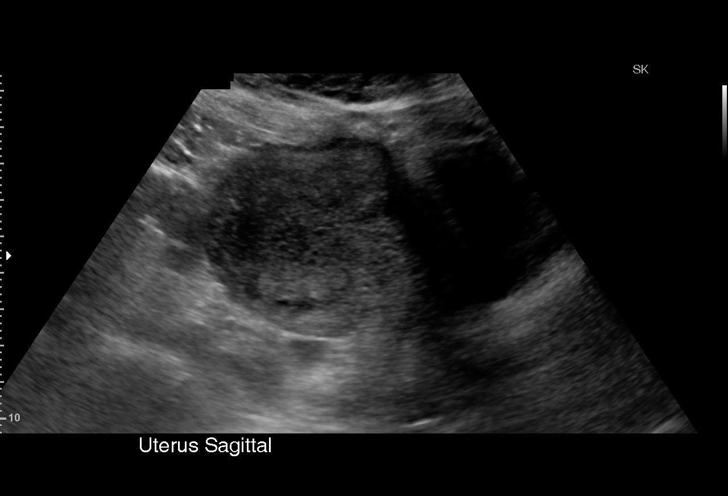
[im 12/67]
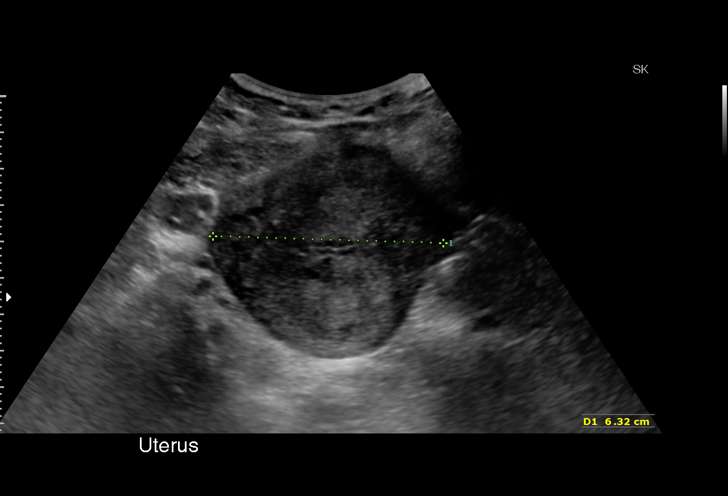
[im 14/67]
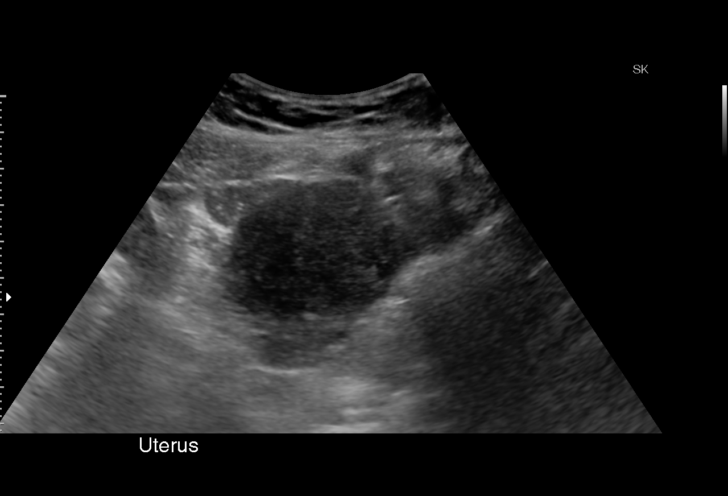
[im 20/67]
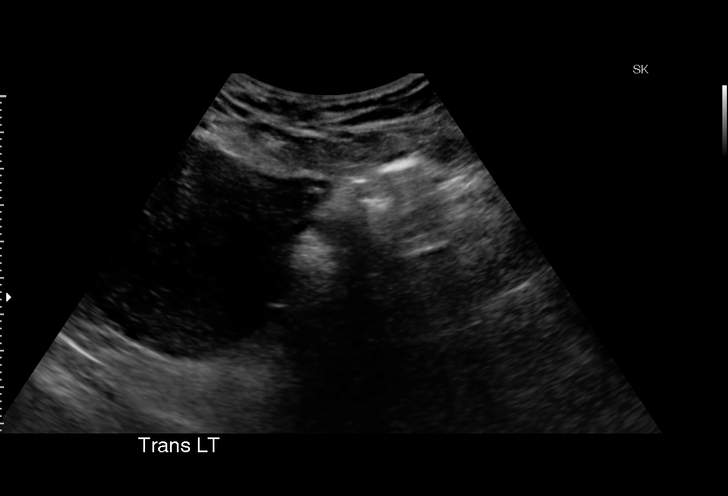
[im 25/67]
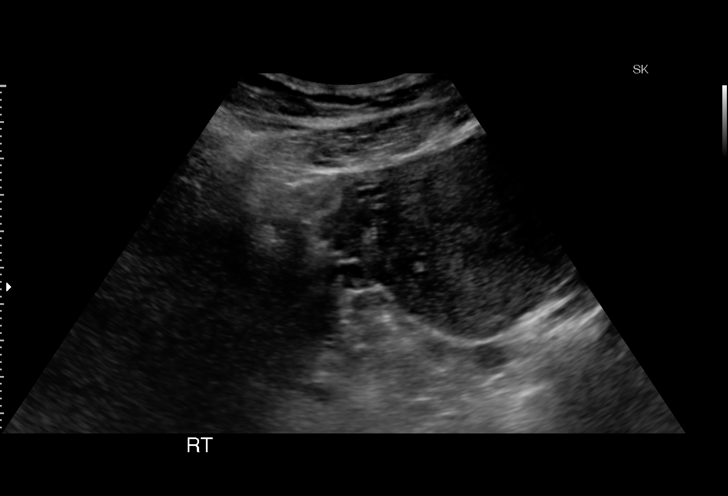
[im 28/67]
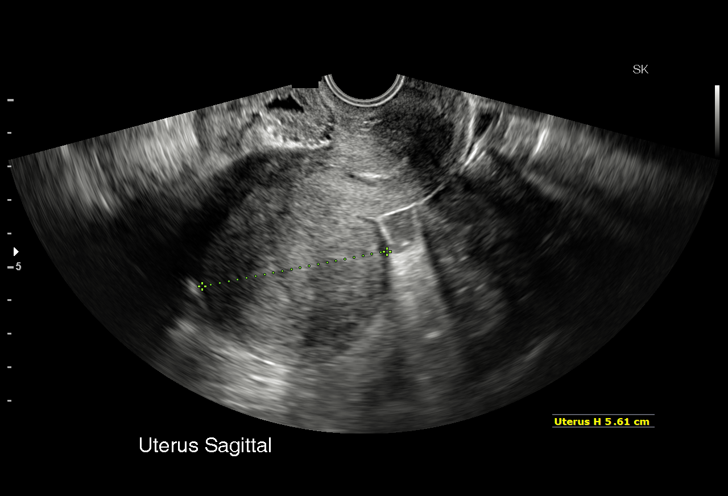
[im 34/67]
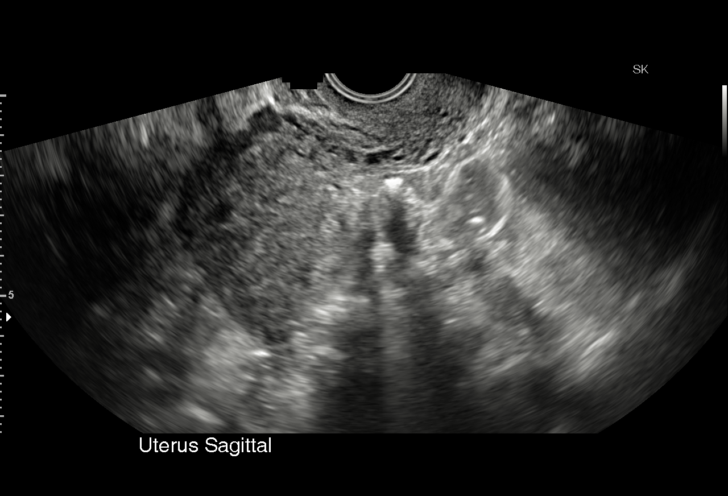
[im 39/67]
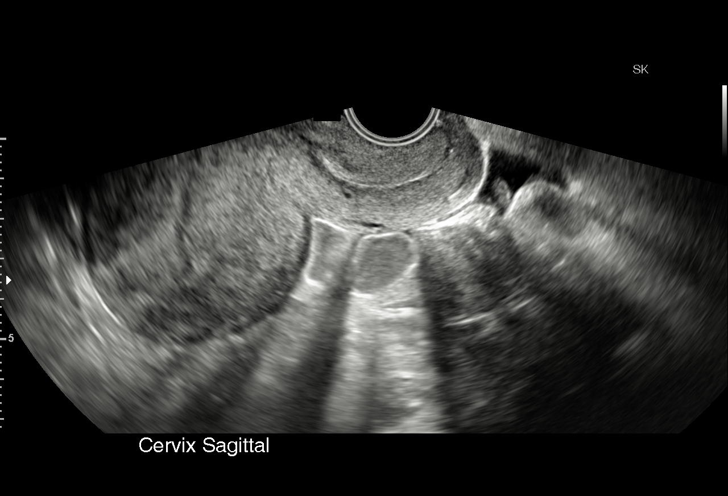
[im 42/67]
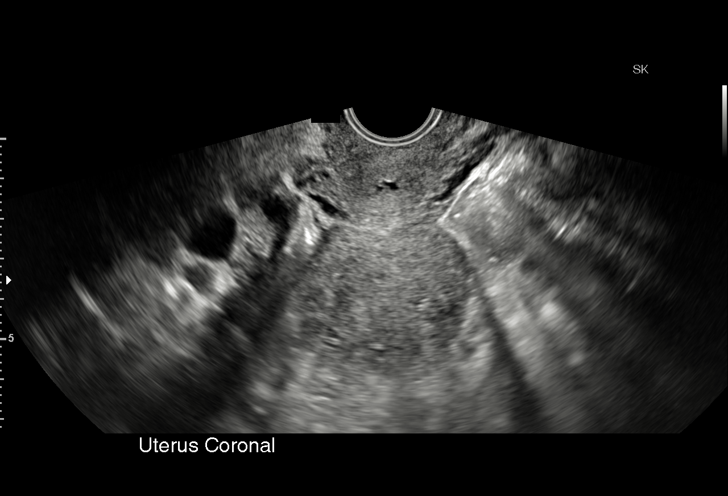
[im 47/67]
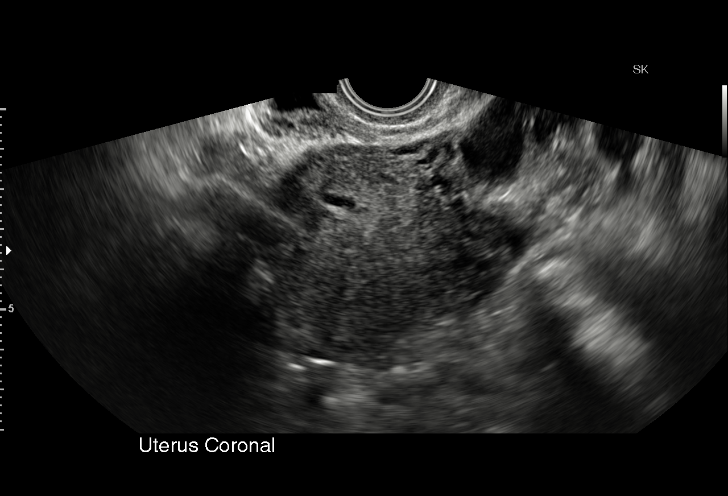
[im 53/67]
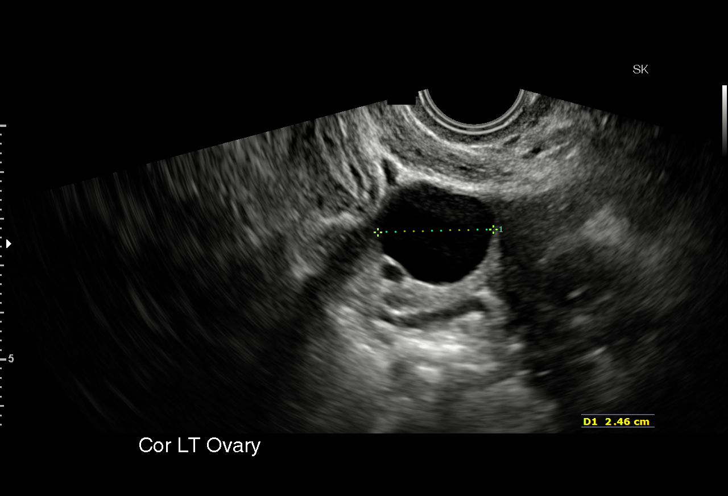
[im 56/67]
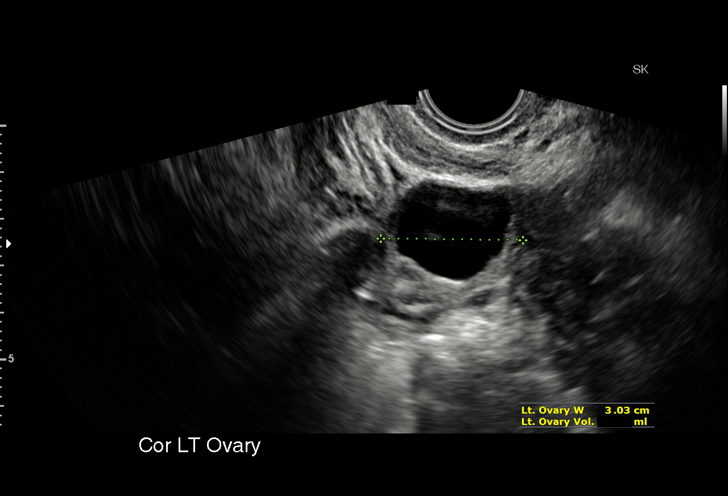
[im 61/67]
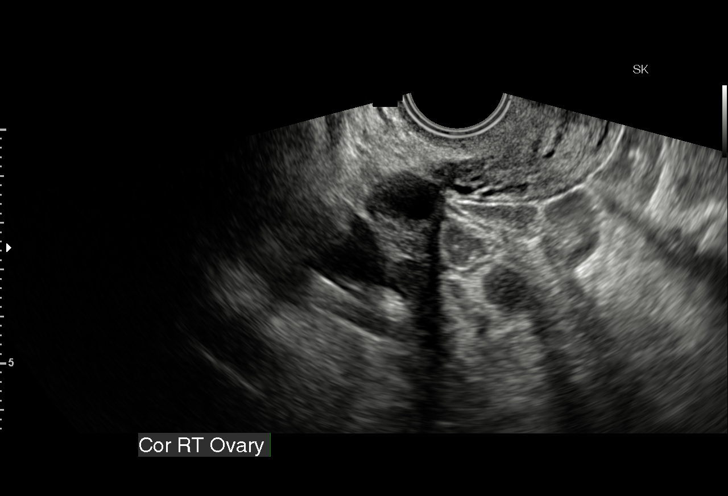
[im 67/67]
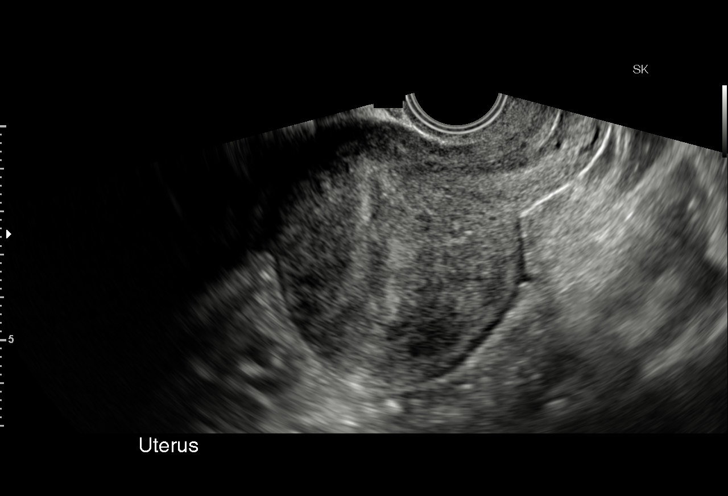

[15 of 25 positions shown; findings below may reference images not displayed]

FINDINGS: Uterus

Measurements: 11.5 x 5.6 x 5.9 cm. Retroverted without evidence for
fibroids. Myometrial echotexture is slightly heterogeneous.

Endometrium

Thickness: 7 mm. Difficult to visualize given uterine position and
anatomy.

Right ovary

Measurements: 4.6 x 1.9 x 1.6 cm. Normal appearance/no adnexal mass.

Left ovary

Measurements: 4.1 x 2.9 x 3.0 cm. 2.9 cm simple cyst in the ovary.

Other findings

No abnormal free fluid.
IMPRESSION: 1. Slight heterogeneity of the myometrial echotexture. Imaging
features raise the question of adenomyosis.
2. Otherwise unremarkable study.

## 2017-11-12 ENCOUNTER — Ambulatory Visit (INDEPENDENT_AMBULATORY_CARE_PROVIDER_SITE_OTHER): Payer: Self-pay

## 2017-11-12 ENCOUNTER — Ambulatory Visit (INDEPENDENT_AMBULATORY_CARE_PROVIDER_SITE_OTHER): Payer: 59

## 2017-11-12 DIAGNOSIS — M21611 Bunion of right foot: Secondary | ICD-10-CM | POA: Diagnosis not present

## 2017-11-12 DIAGNOSIS — D492 Neoplasm of unspecified behavior of bone, soft tissue, and skin: Secondary | ICD-10-CM

## 2017-11-12 DIAGNOSIS — M2012 Hallux valgus (acquired), left foot: Secondary | ICD-10-CM

## 2017-11-12 NOTE — Progress Notes (Signed)
Patient is here today for the postoperative visit.  Date of surgery 09/22/2017.  Procedure performed [place] bunionectomy and metatarsal osteotomy fourth toe right foot excision benign lesion right foot.  She says today that she is still having some soreness in her big toe but she has been able to wear regular shoe all the time.  Well-healing postoperative foot with some stiffness and big toe joint.  Patient states she was having some difficulty with range of motion of her big toe joint.  Physical therapy initiated.  Patient is to follow-up in 4 weeks or sooner with any acute symptom changes.

## 2017-11-18 DIAGNOSIS — M25674 Stiffness of right foot, not elsewhere classified: Secondary | ICD-10-CM | POA: Diagnosis not present

## 2017-11-18 DIAGNOSIS — M25474 Effusion, right foot: Secondary | ICD-10-CM | POA: Diagnosis not present

## 2017-11-18 DIAGNOSIS — R269 Unspecified abnormalities of gait and mobility: Secondary | ICD-10-CM | POA: Diagnosis not present

## 2017-11-18 DIAGNOSIS — M25571 Pain in right ankle and joints of right foot: Secondary | ICD-10-CM | POA: Diagnosis not present

## 2017-11-19 DIAGNOSIS — M25674 Stiffness of right foot, not elsewhere classified: Secondary | ICD-10-CM | POA: Diagnosis not present

## 2017-11-19 DIAGNOSIS — M25571 Pain in right ankle and joints of right foot: Secondary | ICD-10-CM | POA: Diagnosis not present

## 2017-11-19 DIAGNOSIS — R269 Unspecified abnormalities of gait and mobility: Secondary | ICD-10-CM | POA: Diagnosis not present

## 2017-11-19 DIAGNOSIS — M25474 Effusion, right foot: Secondary | ICD-10-CM | POA: Diagnosis not present

## 2017-11-20 DIAGNOSIS — M25674 Stiffness of right foot, not elsewhere classified: Secondary | ICD-10-CM | POA: Diagnosis not present

## 2017-11-20 DIAGNOSIS — R269 Unspecified abnormalities of gait and mobility: Secondary | ICD-10-CM | POA: Diagnosis not present

## 2017-11-20 DIAGNOSIS — M25571 Pain in right ankle and joints of right foot: Secondary | ICD-10-CM | POA: Diagnosis not present

## 2017-11-20 DIAGNOSIS — M25474 Effusion, right foot: Secondary | ICD-10-CM | POA: Diagnosis not present

## 2017-11-20 MED FILL — CITALOPRAM HBR 40 MG TABLET: 40 | 30 days supply | Qty: 30 | Fill #2

## 2017-12-16 ENCOUNTER — Encounter: Payer: 59 | Admitting: Podiatry

## 2017-12-18 ENCOUNTER — Encounter: Payer: 59 | Admitting: Podiatry

## 2017-12-23 ENCOUNTER — Ambulatory Visit (INDEPENDENT_AMBULATORY_CARE_PROVIDER_SITE_OTHER): Payer: 59

## 2017-12-23 ENCOUNTER — Encounter: Payer: Self-pay | Admitting: Podiatry

## 2017-12-23 ENCOUNTER — Ambulatory Visit (INDEPENDENT_AMBULATORY_CARE_PROVIDER_SITE_OTHER): Payer: 59 | Admitting: Podiatry

## 2017-12-23 DIAGNOSIS — M2011 Hallux valgus (acquired), right foot: Secondary | ICD-10-CM | POA: Diagnosis not present

## 2017-12-23 DIAGNOSIS — M21611 Bunion of right foot: Secondary | ICD-10-CM

## 2017-12-24 NOTE — Progress Notes (Signed)
Subjective:   Patient ID: Sherry [name], female   DOB: 37 y.o.   MRN: [id number]   HPI Patient states doing very well with right foot with minimal discomfort and the callus seems to have resolved   ROS      Objective:  Physical Exam  Neurovascular status intact well-healed surgical site right fourth and first metatarsal with excellent range of motion first MPJ with no crepitus     Assessment:  3 months after having foot surgery well doing extremely good     Plan:  Reviewed final x-ray allow patient return to normal activity and reappoint if any symptoms indicate  X-rays indicate fixation in place joint congruous excellent alignment

## 2017-12-31 MED FILL — CITALOPRAM HBR 40 MG TABLET: 40 | 30 days supply | Qty: 30 | Fill #3

## 2018-01-04 ENCOUNTER — Ambulatory Visit: Payer: Self-pay | Admitting: Family Medicine

## 2018-01-08 ENCOUNTER — Encounter: Payer: Self-pay | Admitting: Family Medicine

## 2018-01-08 ENCOUNTER — Ambulatory Visit: Payer: 59 | Admitting: Family Medicine

## 2018-01-08 VITALS — BP 136/90 | HR 64 | Temp 98.2°F | Wt 187.0 lb

## 2018-01-08 DIAGNOSIS — F339 Major depressive disorder, recurrent, unspecified: Secondary | ICD-10-CM | POA: Diagnosis not present

## 2018-01-08 MED ORDER — CITALOPRAM HYDROBROMIDE 40 MG PO TABS: 40.0000 mg | ORAL_TABLET | Freq: Every day | ORAL | 3 refills | Status: DC

## 2018-01-08 NOTE — Progress Notes (Signed)
Subjective:    Patient ID: Sherry [name], female    DOB: Dec 17, 1980, 37 y.o.   MRN: [id number]   No chief complaint on file. Accompanied by her 72-year-old daughter.  HPI Patient was seen today for follow-up.  Pt states her mood is improving.  She admits for a while she stopped taking the Celexa 2/2 financial reasons.  She noticed a numb numbness/tingling sensation throughout her body.  Pt has since restarted it and is feeling better.  Pt has been trying to find a job.  Pt is working to control her stress and anxiety.  Past Medical History:  Diagnosis Date  . Anxiety   . Depression   . GERD (gastroesophageal reflux disease)    with pregnancy  . H/O chlamydia infection   . 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 Severe anemia 06/06/2015  . Shortness of breath dyspnea    with exertion    No Known Allergies  ROS General: Denies fever, chills, night sweats, changes in weight, changes in appetite HEENT: Denies headaches, ear pain, changes in vision, rhinorrhea, sore throat CV: Denies CP, palpitations, SOB, orthopnea Pulm: Denies SOB, cough, wheezing GI: Denies abdominal pain, nausea, vomiting, diarrhea, constipation GU: Denies dysuria, hematuria, frequency, vaginal discharge Msk: Denies muscle cramps, joint pains Neuro: Denies weakness, numbness, tingling Skin: Denies rashes, bruising Psych: Denies hallucinations  + anxiety and depression    Objective:    Blood pressure 136/90, pulse 64, temperature 98.2 F (36.8 C), temperature source Oral, weight 187 lb (84.8 kg), SpO2 98 %.   Gen. Pleasant, well-nourished, in no distress, normal affect   Lungs: no accessory muscle use Cardiovascular: RRR, no peripheral edema Neuro:  A&Ox3, CN II-XII intact, normal gait   Wt Readings from Last 3 Encounters:  01/08/18 187 lb (84.8 kg)  10/05/17 186 lb (84.4 kg)  08/10/17 186 lb 6.4 oz (84.6 kg)    Lab Results  Component Value Date   WBC 9.7 10/05/2017   HGB 13.6 10/05/2017    HCT 40.2 10/05/2017   PLT 265.0 10/05/2017   GLUCOSE 72 10/05/2017   CHOL 176 10/05/2017   TRIG 100.0 10/05/2017   HDL 49.10 10/05/2017   LDLCALC 107 (H) 10/05/2017   ALT 9 09/25/2016   AST 19 09/25/2016   NA 139 10/05/2017   K 3.9 10/05/2017   CL 102 10/05/2017   CREATININE 0.82 10/05/2017   BUN 12 10/05/2017   CO2 28 10/05/2017   TSH 1.450 09/25/2016   INR 1.22 06/06/2015   HGBA1C 5.5 10/05/2017    Assessment/Plan:  Depression, recurrent (HCC) -Improving -Continue Celexa 40 mg daily  Follow-up in the next few months  Grier Mitts, MD

## 2018-03-03 MED FILL — CITALOPRAM HBR 40 MG TABLET: 40 | 30 days supply | Qty: 30 | Fill #0

## 2018-04-21 MED FILL — CITALOPRAM HBR 40 MG TABLET: 40 | 30 days supply | Qty: 30 | Fill #1

## 2018-06-09 MED FILL — CITALOPRAM HBR 40 MG TABLET: 40 | 30 days supply | Qty: 30 | Fill #2 | Status: TO

## 2018-07-21 MED FILL — CITALOPRAM HBR 40 MG TABLET: 40 | 30 days supply | Qty: 30 | Fill #0

## 2018-08-23 ENCOUNTER — Encounter: Payer: Self-pay | Admitting: Family Medicine

## 2018-09-01 MED FILL — CITALOPRAM HBR 40 MG TABLET: 40 | 30 days supply | Qty: 30 | Fill #1

## 2018-10-06 MED FILL — CITALOPRAM HBR 40 MG TABLET: 40 | 30 days supply | Qty: 30 | Fill #2

## 2018-11-15 MED FILL — CITALOPRAM HBR 40 MG TABLET: 40 | 30 days supply | Qty: 30 | Fill #3

## 2018-12-27 MED FILL — CITALOPRAM HBR 40 MG TABLET: 40 | 30 days supply | Qty: 30 | Fill #4

## 2019-01-13 ENCOUNTER — Encounter: Payer: 59 | Admitting: Family Medicine

## 2019-02-09 ENCOUNTER — Encounter: Payer: 59 | Admitting: Family Medicine

## 2019-04-14 ENCOUNTER — Encounter (HOSPITAL_COMMUNITY): Payer: Self-pay

## 2019-04-14 ENCOUNTER — Ambulatory Visit (HOSPITAL_COMMUNITY)
Admission: EM | Admit: 2019-04-14 | Discharge: 2019-04-14 | Disposition: A | Discharge status: Home / Self Care | Payer: Medicaid Other | Attending: Emergency Medicine | Admitting: Emergency Medicine

## 2019-04-14 ENCOUNTER — Other Ambulatory Visit: Payer: Self-pay

## 2019-04-14 DIAGNOSIS — Z3202 Encounter for pregnancy test, result negative: Secondary | ICD-10-CM

## 2019-04-14 DIAGNOSIS — R1030 Lower abdominal pain, unspecified: Secondary | ICD-10-CM | POA: Insufficient documentation

## 2019-04-14 LAB — POCT PREGNANCY, URINE: Preg Test, Ur: NEGATIVE

## 2019-04-14 LAB — CBC
HCT: 42.9 % (ref 36.0–46.0)
Hemoglobin: 14.2 g/dL (ref 12.0–15.0)
MCH: 29.3 pg (ref 26.0–34.0)
MCHC: 33.1 g/dL (ref 30.0–36.0)
MCV: 88.5 fL (ref 80.0–100.0)
Platelets: 303 10*3/uL (ref 150–400)
RBC: 4.85 MIL/uL (ref 3.87–5.11)
RDW: 13.1 % (ref 11.5–15.5)
WBC: 11.5 10*3/uL — ABNORMAL HIGH (ref 4.0–10.5)
nRBC: 0 % (ref 0.0–0.2)

## 2019-04-14 LAB — POCT URINALYSIS DIP (DEVICE)
Bilirubin Urine: NEGATIVE
Glucose, UA: NEGATIVE mg/dL
Ketones, ur: NEGATIVE mg/dL
Leukocytes,Ua: NEGATIVE
Nitrite: NEGATIVE
Protein, ur: NEGATIVE mg/dL
Specific Gravity, Urine: >=1.03 (ref 1.005–1.030)
Urobilinogen, UA: 0.2 mg/dL (ref 0.0–1.0)
pH: 7.5 (ref 5.0–8.0)

## 2019-04-14 LAB — POC URINE PREG, ED: Preg Test, Ur: NEGATIVE

## 2019-04-14 MED ORDER — NAPROXEN 500 MG PO TABS: 500.0000 mg | ORAL_TABLET | Freq: Two times a day (BID) | ORAL | 0 refills | Status: DC

## 2019-04-14 MED ORDER — ACETAMINOPHEN-CAFF-PYRILAMINE 500-60-15 MG PO TABS: 2.0000 | ORAL_TABLET | Freq: Three times a day (TID) | ORAL | 0 refills | Status: AC | PRN

## 2019-04-14 NOTE — ED Triage Notes (Signed)
Pt states she has some right side pelvis pain. This started yesterday.

## 2019-04-14 NOTE — Discharge Instructions (Addendum)
Pregnancy test negative, urine without signs of infection We will send swab off to rule out any vaginal infections contributing to pain Continue to monitor bleeding; we are checking her hemoglobin, I will only call you if this is abnormal You may try Naprosyn twice daily as needed for pain/cramping, as alternative may use Tylenol combination medicine/Midol maximum strength  Please follow-up with OB/GYN for further evaluation of discomfort if persisting Symptoms significantly worsening please follow-up in emergency room

## 2019-04-16 NOTE — ED Provider Notes (Signed)
[facility]    CSN: [id number] Arrival date & time: 04/14/19  1623      History   Chief Complaint Chief Complaint  Patient presents with  . Hip Pain    HPI Sherry [name] is a 39 y.o. female history of abnormal uterine bleeding, GERD, prior tubal ligation presenting today for evaluation of abdominal pain.  Patient has had pain to her lower abdomen especially on the right side over the past 2 days.  She notes that her menstrual started 2 days ago and then stopped for a day but then began bleeding again today.  She has had a lot of pain and cramping but seems worse than her typical cramping associated with her menstrual cycles.  She has been using Tylenol for pain as well as hot soaks in the bathtub.  She notes that 2 years ago she did have a hysteroscopy as she was having abnormal heavy bleeding, but since she has not had as many problems with her menstrual cycles.  At that time she did end up having a significant drop in her hemoglobin where she required transfusion.  She denies any concerns for STDs and denies any new partners.  She is in a monogamous relationship with her husband.  She is not on any form of birth control due to her tubal ligation.  She denies any nausea or vomiting.  Has had more frequent falls, but denies diarrhea.  Denies worsening of pain with eating.  Denies dysuria, increased frequency or urgency.  HPI  Past Medical History:  Diagnosis Date  . Anxiety   . Depression   . GERD (gastroesophageal reflux disease)    with pregnancy  . H/O chlamydia infection   . 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 Severe anemia 06/06/2015  . Shortness of breath dyspnea    with exertion    Patient Active Problem List   Diagnosis Date Noted  . Postpartum depression 08/19/2017  . Postpartum state 08/19/2017  . 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 Depression, recurrent ([facility]) 01/22/2017  . Abnormal uterine bleeding (AUB) 06/27/2015  . Severe  anemia 06/06/2015  . Symptomatic anemia 06/06/2015  . Status post repeat low transverse cesarean section 04/03/2014  . ASCUS with positive high risk HPV 10/19/2012  . Yeast dermatitis 10/19/2012    Past Surgical History:  Procedure Laterality Date  . BARTHOLIN CYST MARSUPIALIZATION    . CESAREAN SECTION     x2  . CESAREAN SECTION WITH BILATERAL TUBAL LIGATION N/A 04/03/2014   Procedure: CESAREAN SECTION WITH BILATERAL TUBAL LIGATION;  Surgeon: Jerelyn Charles, MD;  Location: [facility];  Service: Obstetrics;  Laterality: N/A;  . DENTAL SURGERY  2010   6 teeth surgically removed  . HYSTEROSCOPY WITH NOVASURE N/A 06/27/2015   Procedure: HYSTEROSCOPY WITH NOVASURE;  Surgeon: Lavonia Drafts, MD;  Location: [facility];  Service: Gynecology;  Laterality: N/A;    OB History    Gravida  3   Para  3   Term  3   Preterm      AB      Living  2     SAB      TAB      Ectopic      Multiple  0   Live Births  3            Home Medications    Prior to Admission medications   Medication Sig Start Date End Date Taking? Authorizing Provider  Acetaminophen-Caff-Pyrilamine [id number] MG TABS Take 2 tablets  by mouth every 8 (eight) hours as needed (cramping). No more than 6 tablets per day 04/14/19   Wieters, Hallie C, PA-C  citalopram (CELEXA) 40 MG tablet Take 1 tablet (40 mg total) by mouth daily. 01/08/18   Billie Ruddy, MD  Multiple Vitamins-Minerals (ALIVE WOMENS GUMMY) CHEW Chew 1 each by mouth daily.    [provider]  naproxen (NAPROSYN) 500 MG tablet Take 1 tablet (500 mg total) by mouth 2 (two) times daily. 04/14/19   Wieters, Elesa Hacker, PA-C    Family History Family History  Problem Relation Age of Onset  . Diabetes Mother        Type 2  . Hypertension Mother   . Depression Father   . Diabetes Maternal Aunt   . Hypertension Maternal Aunt   . Hypertension Maternal Aunt     Social History Social History   Tobacco Use  . Smoking status: Never  Smoker  . Smokeless tobacco: Never Used  Substance Use Topics  . Alcohol use: No  . Drug use: No     Allergies   Patient has no known allergies.   Review of Systems Review of Systems  Constitutional: Negative for fever.  Respiratory: Negative for shortness of breath.   Cardiovascular: Negative for chest pain.  Gastrointestinal: Positive for abdominal pain. Negative for diarrhea, nausea and vomiting.  Genitourinary: Positive for pelvic pain and vaginal bleeding. Negative for dysuria, flank pain, genital sores, hematuria, menstrual problem, vaginal discharge and vaginal pain.  Musculoskeletal: Negative for back pain.  Skin: Negative for rash.  Neurological: Negative for dizziness, light-headedness and headaches.     Physical Exam Triage Vital Signs ED Triage Vitals  Enc Vitals Group     BP 04/14/19 1706 (!) 143/96     Pulse Rate 04/14/19 1706 89     Resp 04/14/19 1706 18     Temp 04/14/19 1706 98.8 F (37.1 C)     Temp Source 04/14/19 1706 Oral     SpO2 04/14/19 1706 100 %     Weight 04/14/19 1704 220 lb (99.8 kg)     Height --      Head Circumference --      Peak Flow --      Pain Score 04/14/19 1703 8     Pain Loc --      Pain Edu? --      Excl. in [place]? --    No data found.  Updated Vital Signs BP (!) 143/96 (BP Location: Right Arm)   Pulse 89   Temp 98.8 F (37.1 C) (Oral)   Resp 18   Wt 220 lb (99.8 kg)   SpO2 100%   BMI 36.61 kg/m   Visual Acuity Right Eye Distance:   Left Eye Distance:   Bilateral Distance:    Right Eye Near:   Left Eye Near:    Bilateral Near:     Physical Exam Vitals and nursing note reviewed.  Constitutional:      General: She is not in acute distress.    Appearance: She is well-developed.  HENT:     Head: Normocephalic and atraumatic.  Eyes:     Conjunctiva/sclera: Conjunctivae normal.  Cardiovascular:     Rate and Rhythm: Normal rate and regular rhythm.     Heart sounds: No murmur.  Pulmonary:     Effort:  Pulmonary effort is normal. No respiratory distress.     Breath sounds: Normal breath sounds.  Abdominal:     Palpations: Abdomen is  soft.     Tenderness: There is abdominal tenderness.     Comments: Soft, nondistended, tender to palpation throughout bilateral lower quadrants, more prominent on right, suprapubic area, negative rebound, negative Rovsing, negative McBurney's  Genitourinary:    Comments: Normal external female genitalia, bright red blood present in vagina, vaginal mucosa pink, cervix pink Musculoskeletal:     Cervical back: Neck supple.  Skin:    General: Skin is warm and dry.  Neurological:     Mental Status: She is alert.      UC Treatments / Results  Labs (all labs ordered are listed, but only abnormal results are displayed) Labs Reviewed  CBC - Abnormal; Notable for the following components:      Result Value   WBC 11.5 (*)    All other components within normal limits  POCT URINALYSIS DIP (DEVICE) - Abnormal; Notable for the following components:   Hgb urine dipstick MODERATE (*)    All other components within normal limits  POC URINE PREG, ED  POCT PREGNANCY, URINE  CERVICOVAGINAL ANCILLARY ONLY    EKG   Radiology No results found.  Procedures Procedures (including critical care time)  Medications Ordered in UC Medications - No data to display  Initial Impression / Assessment and Plan / UC Course  I have reviewed the triage vital signs and the nursing notes.  Pertinent labs & imaging results that were available during my care of the patient were reviewed by me and considered in my medical decision making (see chart for details).     Checking CBC to ensure stable hemoglobin.  UA with negative leuks and nitrites, vaginal swab obtained to rule out any STD/vaginal infections contributing to worsening pain.  Pain does seem more pelvic etiology and likely related to bleeding, negative peritoneal signs, do not suspect acute abdominal emergency at this  time.  We will continue to monitor, advised to follow-up in emergency room for worsening abdominal pain.  In the meantime will treat as menstrual cramping and provide Naprosyn as alternative to Tylenol, also provided Midol extra strength if not having relief with Naprosyn.  Discussed following up with OB/GYN.  Discussed strict return precautions. Patient verbalized understanding and is agreeable with plan.  Final Clinical Impressions(s) / UC Diagnoses   Final diagnoses:  Lower abdominal pain     Discharge Instructions     Pregnancy test negative, urine without signs of infection We will send swab off to rule out any vaginal infections contributing to pain Continue to monitor bleeding; we are checking her hemoglobin, I will only call you if this is abnormal You may try Naprosyn twice daily as needed for pain/cramping, as alternative may use Tylenol combination medicine/Midol maximum strength  Please follow-up with OB/GYN for further evaluation of discomfort if persisting Symptoms significantly worsening please follow-up in emergency room      ED Prescriptions    Medication Sig Dispense Auth. Provider   naproxen (NAPROSYN) 500 MG tablet Take 1 tablet (500 mg total) by mouth 2 (two) times daily. 30 tablet Wieters, Hallie C, PA-C   Acetaminophen-Caff-Pyrilamine 500-60-15 MG TABS Take 2 tablets by mouth every 8 (eight) hours as needed (cramping). No more than 6 tablets per day 30 tablet Wieters, Halaula C, PA-C     PDMP not reviewed this encounter.   Janith Lima, [place] 04/16/19 1342

## 2019-04-19 LAB — CERVICOVAGINAL ANCILLARY ONLY
Bacterial vaginitis: NEGATIVE
Candida vaginitis: NEGATIVE
Chlamydia: NEGATIVE
Neisseria Gonorrhea: NEGATIVE
Trichomonas: NEGATIVE

## 2019-05-17 ENCOUNTER — Telehealth: Payer: Self-pay | Admitting: Family Medicine

## 2019-05-17 NOTE — Telephone Encounter (Signed)
Spoke with pt advised that no records of immunization found on [place], advised pt to call Health Department from the state where she was born. Pt verbalized understanding

## 2019-05-17 NOTE — Telephone Encounter (Signed)
Patient is requesting a call-- She needs a copy of her immunization records.  She needs these today or tomorrow.

## 2020-03-21 ENCOUNTER — Ambulatory Visit (INDEPENDENT_AMBULATORY_CARE_PROVIDER_SITE_OTHER): Payer: BC Managed Care – PPO | Admitting: Family Medicine

## 2020-03-21 ENCOUNTER — Encounter: Payer: Self-pay | Admitting: Family Medicine

## 2020-03-21 ENCOUNTER — Other Ambulatory Visit: Payer: Self-pay

## 2020-03-21 VITALS — BP 140/86 | HR 91 | Temp 98.2°F | Wt 211.8 lb

## 2020-03-21 DIAGNOSIS — F339 Major depressive disorder, recurrent, unspecified: Secondary | ICD-10-CM | POA: Diagnosis not present

## 2020-03-21 DIAGNOSIS — M79644 Pain in right finger(s): Secondary | ICD-10-CM | POA: Diagnosis not present

## 2020-03-21 DIAGNOSIS — Z0000 Encounter for general adult medical examination without abnormal findings: Secondary | ICD-10-CM

## 2020-03-21 DIAGNOSIS — F419 Anxiety disorder, unspecified: Secondary | ICD-10-CM

## 2020-03-21 MED ORDER — CITALOPRAM HYDROBROMIDE 20 MG PO TABS: 20.0000 mg | ORAL_TABLET | Freq: Every day | ORAL | 3 refills | Status: DC

## 2020-03-21 NOTE — Patient Instructions (Addendum)
 Preventive Care 21-37 Years Old, Female Preventive care refers to visits with your health care provider and lifestyle choices that can promote health and wellness. This includes:  A yearly physical exam. This may also be called an annual well check.  Regular dental visits and eye exams.  Immunizations.  Screening for certain conditions.  Healthy lifestyle choices, such as eating a healthy diet, getting regular exercise, not using drugs or products that contain nicotine and tobacco, and limiting alcohol use. What can I expect for my preventive care visit? Physical exam Your health care provider will check your:  Height and weight. This may be used to calculate body mass index (BMI), which tells if you are at a healthy weight.  Heart rate and blood pressure.  Skin for abnormal spots. Counseling Your health care provider may ask you questions about your:  Alcohol, tobacco, and drug use.  Emotional well-being.  Home and relationship well-being.  Sexual activity.  Eating habits.  Work and work environment.  Method of birth control.  Menstrual cycle.  Pregnancy history. What immunizations do I need?  Influenza (flu) vaccine  This is recommended every year. Tetanus, diphtheria, and pertussis (Tdap) vaccine  You may need a Td booster every 10 years. Varicella (chickenpox) vaccine  You may need this if you have not been vaccinated. Human papillomavirus (HPV) vaccine  If recommended by your health care provider, you may need three doses over 6 months. Measles, mumps, and rubella (MMR) vaccine  You may need at least one dose of MMR. You may also need a second dose. Meningococcal conjugate (MenACWY) vaccine  One dose is recommended if you are age 19-21 years and a first-year college student living in a residence hall, or if you have one of several medical conditions. You may also need additional booster doses. Pneumococcal conjugate (PCV13) vaccine  You may need  this if you have certain conditions and were not previously vaccinated. Pneumococcal polysaccharide (PPSV23) vaccine  You may need one or two doses if you smoke cigarettes or if you have certain conditions. Hepatitis A vaccine  You may need this if you have certain conditions or if you travel or work in places where you may be exposed to hepatitis A. Hepatitis B vaccine  You may need this if you have certain conditions or if you travel or work in places where you may be exposed to hepatitis B. Haemophilus influenzae type b (Hib) vaccine  You may need this if you have certain conditions. You may receive vaccines as individual doses or as more than one vaccine together in one shot (combination vaccines). Talk with your health care provider about the risks and benefits of combination vaccines. What tests do I need?  Blood tests  Lipid and cholesterol levels. These may be checked every 5 years starting at age 20.  Hepatitis C test.  Hepatitis B test. Screening  Diabetes screening. This is done by checking your blood sugar (glucose) after you have not eaten for a while (fasting).  Sexually transmitted disease (STD) testing.  BRCA-related cancer screening. This may be done if you have a family history of breast, ovarian, tubal, or peritoneal cancers.  Pelvic exam and Pap test. This may be done every 3 years starting at age 21. Starting at age 30, this may be done every 5 years if you have a Pap test in combination with an HPV test. Talk with your health care provider about your test results, treatment options, and if necessary, the need for more   tests. Follow these instructions at home: Eating and drinking   Eat a diet that includes fresh fruits and vegetables, whole grains, lean protein, and low-fat dairy.  Take vitamin and mineral supplements as recommended by your health care provider.  Do not drink alcohol if: ? Your health care provider tells you not to drink. ? You are  pregnant, may be pregnant, or are planning to become pregnant.  If you drink alcohol: ? Limit how much you have to 0-1 drink a day. ? Be aware of how much alcohol is in your drink. In the [place] one drink equals one 12 oz bottle of beer (355 mL), one 5 oz glass of wine (148 mL), or one 1 oz glass of hard liquor (44 mL). Lifestyle  Take daily care of your teeth and gums.  Stay active. Exercise for at least 30 minutes on 5 or more days each week.  Do not use any products that contain nicotine or tobacco, such as cigarettes, e-cigarettes, and chewing tobacco. If you need help quitting, ask your health care provider.  If you are sexually active, practice safe sex. Use a condom or other form of birth control (contraception) in order to prevent pregnancy and STIs (sexually transmitted infections). If you plan to become pregnant, see your health care provider for a preconception visit. What's next?  Visit your health care provider once a year for a well check visit.  Ask your health care provider how often you should have your eyes and teeth checked.  Stay up to date on all vaccines. This information is not intended to replace advice given to you by your health care provider. Make sure you discuss any questions you have with your health care provider. Document Revised: 12/10/2017 Document Reviewed: 12/10/2017 Elsevier Patient Education  [place], Adult After being diagnosed with an anxiety disorder, you may be relieved to know why you have felt or behaved a certain way. You may also feel overwhelmed about the treatment ahead and what it will mean for your life. With care and support, you can manage this condition and recover from it. How to manage lifestyle changes Managing stress and anxiety  Stress is your body's reaction to life changes and events, both good and bad. Most stress will last just a few hours, but stress can be ongoing and can lead to more than just  stress. Although stress can play a major role in anxiety, it is not the same as anxiety. Stress is usually caused by something external, such as a deadline, test, or competition. Stress normally passes after the triggering event has ended.  Anxiety is caused by something internal, such as imagining a terrible outcome or worrying that something will go wrong that will devastate you. Anxiety often does not go away even after the triggering event is over, and it can become long-term (chronic) worry. It is important to understand the differences between stress and anxiety and to manage your stress effectively so that it does not lead to an anxious response. Talk with your health care provider or a counselor to learn more about reducing anxiety and stress. He or she may suggest tension reduction techniques, such as:  Music therapy. This can include creating or listening to music that you enjoy and that inspires you.  Mindfulness-based meditation. This involves being aware of your normal breaths while not trying to control your breathing. It can be done while sitting or walking.  Centering prayer. This involves focusing on a  word, phrase, or sacred image that means something to you and brings you peace.  Deep breathing. To do this, expand your stomach and inhale slowly through your nose. Hold your breath for 3-5 seconds. Then exhale slowly, letting your stomach muscles relax.  Self-talk. This involves identifying thought patterns that lead to anxiety reactions and changing those patterns.  Muscle relaxation. This involves tensing muscles and then relaxing them. Choose a tension reduction technique that suits your lifestyle and personality. These techniques take time and practice. Set aside 5-15 minutes a day to do them. Therapists can offer counseling and training in these techniques. The training to help with anxiety may be covered by some insurance plans. Other things you can do to manage stress and  anxiety include:  Keeping a stress/anxiety diary. This can help you learn what triggers your reaction and then learn ways to manage your response.  Thinking about how you react to certain situations. You may not be able to control everything, but you can control your response.  Making time for activities that help you relax and not feeling guilty about spending your time in this way.  Visual imagery and yoga can help you stay calm and relax.  Medicines Medicines can help ease symptoms. Medicines for anxiety include:  Anti-anxiety drugs.  Antidepressants. Medicines are often used as a primary treatment for anxiety disorder. Medicines will be prescribed by a health care provider. When used together, medicines, psychotherapy, and tension reduction techniques may be the most effective treatment. Relationships Relationships can play a big part in helping you recover. Try to spend more time connecting with trusted friends and family members. Consider going to couples counseling, taking family education classes, or going to family therapy. Therapy can help you and others better understand your condition. How to recognize changes in your anxiety Everyone responds differently to treatment for anxiety. Recovery from anxiety happens when symptoms decrease and stop interfering with your daily activities at home or work. This may mean that you will start to:  Have better concentration and focus. Worry will interfere less in your daily thinking.  Sleep better.  Be less irritable.  Have more energy.  Have improved memory. It is important to recognize when your condition is getting worse. Contact your health care provider if your symptoms interfere with home or work and you feel like your condition is not improving. Follow these instructions at home: Activity  Exercise. Most adults should do the following: ? Exercise for at least 150 minutes each week. The exercise should increase your heart rate  and make you sweat (moderate-intensity exercise). ? Strengthening exercises at least twice a week.  Get the right amount and quality of sleep. Most adults need 7-9 hours of sleep each night. Lifestyle   Eat a healthy diet that includes plenty of vegetables, fruits, whole grains, low-fat dairy products, and lean protein. Do not eat a lot of foods that are high in solid fats, added sugars, or salt.  Make choices that simplify your life.  Do not use any products that contain nicotine or tobacco, such as cigarettes, e-cigarettes, and chewing tobacco. If you need help quitting, ask your health care provider.  Avoid caffeine, alcohol, and certain over-the-counter cold medicines. These may make you feel worse. Ask your pharmacist which medicines to avoid. General instructions  Take over-the-counter and prescription medicines only as told by your health care provider.  Keep all follow-up visits as told by your health care provider. This is important. Where to find support  You can get help and support from these sources:  Self-help groups.  Online and [employer].  A trusted spiritual leader.  Couples counseling.  Family education classes.  Family therapy. Where to find more information You may find that joining a support group helps you deal with your anxiety. The following sources can help you locate counselors or support groups near you:  [place]: www.mentalhealthamerica.net  Anxiety and Depression Association of [place] (ADAA): https://www.clark.net/  National Alliance on Mental Illness (NAMI): www.nami.org Contact a health care provider if you:  Have a hard time staying focused or finishing daily tasks.  Spend many hours a day feeling worried about everyday life.  Become exhausted by worry.  Start to have headaches, feel tense, or have nausea.  Urinate more than normal.  Have diarrhea. Get help right away if you have:  A racing heart and shortness  of breath.  Thoughts of hurting yourself or others. If you ever feel like you may hurt yourself or others, or have thoughts about taking your own life, get help right away. You can go to your nearest emergency department or call:  Your local emergency services (911 in the [place]  A suicide crisis helpline, such as the [place] at 782-033-2003. This is open 24 hours a day. Summary  Taking steps to learn and use tension reduction techniques can help calm you and help prevent triggering an anxiety reaction.  When used together, medicines, psychotherapy, and tension reduction techniques may be the most effective treatment.  Family, friends, and partners can play a big part in helping you recover from an anxiety disorder. This information is not intended to replace advice given to you by your health care provider. Make sure you discuss any questions you have with your health care provider. Document Revised: 08/31/2018 Document Reviewed: 08/31/2018 Elsevier Patient Education  [place] With Depression Everyone experiences occasional disappointment, sadness, and loss in their lives. When you are feeling down, blue, or sad for at least 2 weeks in a row, it may mean that you have depression. Depression can affect your thoughts and feelings, relationships, daily activities, and physical health. It is caused by changes in the way your brain functions. If you receive a diagnosis of depression, your health care provider will tell you which type of depression you have and what treatment options are available to you. If you are living with depression, there are ways to help you recover from it and also ways to prevent it from coming back. How to cope with lifestyle changes Coping with stress     Stress is your body's reaction to life changes and events, both good and bad. Stressful situations may include:  Getting married.  The death of a  spouse.  Losing a job.  Retiring.  Having a baby. Stress can last just a few hours or it can be ongoing. Stress can play a major role in depression, so it is important to learn both how to cope with stress and how to think about it differently. Talk with your health care provider or a counselor if you would like to learn more about stress reduction. He or she may suggest some stress reduction techniques, such as:  Music therapy. This can include creating music or listening to music. Choose music that you enjoy and that inspires you.  Mindfulness-based meditation. This kind of meditation can be done while sitting or walking. It involves being aware of your normal breaths, rather than  trying to control your breathing.  Centering prayer. This is a kind of meditation that involves focusing on a spiritual word or phrase. Choose a word, phrase, or sacred image that is meaningful to you and that brings you peace.  Deep breathing. To do this, expand your stomach and inhale slowly through your nose. Hold your breath for 3-5 seconds, then exhale slowly, allowing your stomach muscles to relax.  Muscle relaxation. This involves intentionally tensing muscles then relaxing them. Choose a stress reduction technique that fits your lifestyle and personality. Stress reduction techniques take time and practice to develop. Set aside 5-15 minutes a day to do them. Therapists can offer training in these techniques. The training may be covered by some insurance plans. Other things you can do to manage stress include:  Keeping a stress diary. This can help you learn what triggers your stress and ways to control your response.  Understanding what your limits are and saying no to requests or events that lead to a schedule that is too full.  Thinking about how you respond to certain situations. You may not be able to control everything, but you can control how you react.  Adding humor to your life by watching funny  films or TV shows.  Making time for activities that help you relax and not feeling guilty about spending your time this way.  Medicines Your health care provider may suggest certain medicines if he or she feels that they will help improve your condition. Avoid using alcohol and other substances that may prevent your medicines from working properly (may interact). It is also important to:  Talk with your pharmacist or health care provider about all the medicines that you take, their possible side effects, and what medicines are safe to take together.  Make it your goal to take part in all treatment decisions (shared decision-making). This includes giving input on the side effects of medicines. It is best if shared decision-making with your health care provider is part of your total treatment plan. If your health care provider prescribes a medicine, you may not notice the full benefits of it for 4-8 weeks. Most people who are treated for depression need to be on medicine for at least 6-12 months after they feel better. If you are taking medicines as part of your treatment, do not stop taking medicines without first talking to your health care provider. You may need to have the medicine slowly decreased (tapered) over time to decrease the risk of harmful side effects. Relationships Your health care provider may suggest family therapy along with individual therapy and drug therapy. While there may not be family problems that are causing you to feel depressed, it is still important to make sure your family learns as much as they can about your mental health. Having your family's support can help make your treatment successful. How to recognize changes in your condition Everyone has a different response to treatment for depression. Recovery from major depression happens when you have not had signs of major depression for two months. This may mean that you will start to:  Have more interest in doing  activities.  Feel less hopeless than you did 2 months ago.  Have more energy.  Overeat less often, or have better or improving appetite.  Have better concentration. Your health care provider will work with you to decide the next steps in your recovery. It is also important to recognize when your condition is getting worse. Watch for these signs:  Having fatigue or low energy.  Eating too much or too little.  Sleeping too much or too little.  Feeling restless, agitated, or hopeless.  Having trouble concentrating or making decisions.  Having unexplained physical complaints.  Feeling irritable, angry, or aggressive. Get help as soon as you or your family members notice these symptoms coming back. How to get support and help from others How to talk with friends and family members about your condition  Talking to friends and family members about your condition can provide you with one way to get support and guidance. Reach out to trusted friends or family members, explain your symptoms to them, and let them know that you are working with a health care provider to treat your depression. Financial resources Not all insurance plans cover mental health care, so it is important to check with your insurance carrier. If paying for co-pays or counseling services is a problem, search for a local or county mental health care center. They may be able to offer public mental health care services at low or no cost when you are not able to see a private health care provider. If you are taking medicine for depression, you may be able to get the generic form, which may be less expensive. Some makers of prescription medicines also offer help to patients who cannot afford the medicines they need. Follow these instructions at home:   Get the right amount and quality of sleep.  Cut down on using caffeine, tobacco, alcohol, and other potentially harmful substances.  Try to exercise, such as walking or  lifting small weights.  Take over-the-counter and prescription medicines only as told by your health care provider.  Eat a healthy diet that includes plenty of vegetables, fruits, whole grains, low-fat dairy products, and lean protein. Do not eat a lot of foods that are high in solid fats, added sugars, or salt.  Keep all follow-up visits as told by your health care provider. This is important. Contact a health care provider if:  You stop taking your antidepressant medicines, and you have any of these symptoms: ? Nausea. ? Headache. ? Feeling lightheaded. ? Chills and body aches. ? Not being able to sleep (insomnia).  You or your friends and family think your depression is getting worse. Get help right away if:  You have thoughts of hurting yourself or others. If you ever feel like you may hurt yourself or others, or have thoughts about taking your own life, get help right away. You can go to your nearest emergency department or call:  Your local emergency services (911 in the [place]  A suicide crisis helpline, such as the [place] at (253)170-5406. This is open 24-hours a day. Summary  If you are living with depression, there are ways to help you recover from it and also ways to prevent it from coming back.  Work with your health care team to create a management plan that includes counseling, stress management techniques, and healthy lifestyle habits. This information is not intended to replace advice given to you by your health care provider. Make sure you discuss any questions you have with your health care provider. Document Revised: 07/23/2018 Document Reviewed: 03/03/2016 Elsevier Patient Education  [place].  Tendinitis  Tendinitis is swelling (inflammation) of a tendon. A tendon is a cord of tissue that connects muscle to bone. Tendinitis can cause pain, tenderness, and swelling. What are the causes?  Using a tendon or muscle too  much (overuse).  This is a common cause.  Wear and tear that happens as you age.  Injury.  Some medical conditions, such as arthritis.  Some medicines. What increases the risk? You are more likely to get this condition if you do activities that involve the same movements over and over again (repetitive motions). What are the signs or symptoms?  Pain.  Tenderness.  Mild swelling.  Decreased range of motion. How is this treated? This condition is usually treated with RICE therapy. RICE stands for:  Rest.  Ice.  Compression. This means putting pressure on the affected area.  Elevation. This means raising the affected area above the level of your heart. Treatment may also include:  Medicines for swelling or pain.  Exercises or physical therapy.  A brace or splint.  Surgery. This is rarely needed. Follow these instructions at home: If you have a splint or brace:  Wear the splint or brace as told by your doctor. Remove it only as told by your doctor.  Loosen the splint or brace if your fingers or toes: ? Tingle. ? Become numb. ? Turn cold and blue.  Keep the splint or brace clean.  If the splint or brace is not waterproof: ? Do not let it get wet. ? Cover it with a watertight covering when you take a bath or shower. Managing pain, stiffness, and swelling      If told, put ice on the affected area. ? If you have a removable splint or brace, remove it as told by your doctor. ? Put ice in a plastic bag. ? Place a towel between your skin and the bag. ? Leave the ice on for 20 minutes, 2-3 times a day.  Move the fingers or toes of the affected arm or leg often, if this applies. This helps to prevent stiffness and to lessen swelling.  If told, raise the affected area above the level of your heart while you are sitting or lying down.  If told, put heat on the affected area before you exercise. Use the heat source that your doctor recommends, such as a moist heat  pack or a heating pad. ? Place a towel between your skin and the heat source. ? Leave the heat on for 20-30 minutes. ? Remove the heat if your skin turns bright red. This is very important if you are unable to feel pain, heat, or cold. You may have a greater risk of getting burned. Driving  Do not drive or use heavy machinery while taking prescription pain medicine.  Ask your doctor when it is safe to drive if you have a splint or brace on any part of your arm or leg. Activity  Rest the affected area as told by your doctor.  Return to your normal activities as told by your doctor. Ask your doctor what activities are safe for you.  Avoid using the affected area while you have symptoms.  Do exercises as told by your doctor. General instructions  If you have a splint, do not put pressure on any part of the splint until it is fully hardened. This may take several hours.  Wear an elastic bandage or pressure (compression) wrap only as told by your doctor.  Take over-the-counter and prescription medicines only as told by your doctor.  Keep all follow-up visits as told by your doctor. This is important. Contact a doctor if:  You do not get better.  You get new problems, such as numbness in your hands, and you do  not know why. Summary  Tendinitis is swelling (inflammation) of a tendon.  You are more likely to get this condition if you do activities that involve the same movements over and over again.  This condition is usually treated with RICE therapy. RICE stands for rest, ice, compression, and elevate.  Avoid using the affected area while you have symptoms. This information is not intended to replace advice given to you by your health care provider. Make sure you discuss any questions you have with your health care provider. Document Revised: 10/06/2017 Document Reviewed: 08/19/2017 Elsevier Patient Education  [place].

## 2020-03-21 NOTE — Progress Notes (Signed)
Subjective:     Sherry [name] is a 39 y.o. female and is here for a comprehensive physical exam. The patient reports pain in right thumb, increased anxiety and depression symptoms.  Patient recently started a new job but is afraid her anxiety will quit.  Patient quit several other job 2/2 anxiety/feeling like people were talking about her.  Pt notes unable to f/u with [facility] 2/2 lapse in insurance coverage.  Now has insurance and is trying to f/u on things.  Interested in restarting medication, previously on Celexa.  LMP 02/16/2020.  Last Pap 09/25/2016.  Social History   Socioeconomic History  . Marital status: Married    Spouse name: Not on file  . Number of children: Not on file  . Years of education: Not on file  . Highest education level: Not on file  Occupational History  . Not on file  Tobacco Use  . Smoking status: Never Smoker  . Smokeless tobacco: Never Used  Substance and Sexual Activity  . Alcohol use: No  . Drug use: No  . Sexual activity: Never    Partners: Male    Birth control/protection: Surgical  Other Topics Concern  . Not on file  Social History Narrative         Social Determinants of Health   Financial Resource Strain:   . Difficulty of Paying Living Expenses: Not on file  Food Insecurity:   . Worried About Charity fundraiser in the Last Year: Not on file  . Ran Out of Food in the Last Year: Not on file  Transportation Needs:   . Lack of Transportation (Medical): Not on file  . Lack of Transportation (Non-Medical): Not on file  Physical Activity:   . Days of Exercise per Week: Not on file  . Minutes of Exercise per Session: Not on file  Stress:   . Feeling of Stress : Not on file  Social Connections:   . Frequency of Communication with Friends and Family: Not on file  . Frequency of Social Gatherings with Friends and Family: Not on file  . Attends Religious Services: Not on file  . Active Member of Clubs or Organizations: Not on file  . Attends  Archivist Meetings: Not on file  . Marital Status: Not on file  Intimate Partner Violence:   . Fear of Current or Ex-Partner: Not on file  . Emotionally Abused: Not on file  . Physically Abused: Not on file  . Sexually Abused: Not on file   Health Maintenance  Topic Date Due  . Hepatitis C Screening  Never done  . COVID-19 Vaccine (1) Never done  . PAP SMEAR-Modifier  09/26/2019  . INFLUENZA VACCINE  11/13/2019  . TETANUS/TDAP  04/04/2024  . HIV Screening  Completed    The following portions of the patient's history were reviewed and updated as appropriate: allergies, current medications, past family history, past medical history, past social history, past surgical history and problem list.  Review of Systems Pertinent items noted in HPI and remainder of comprehensive ROS otherwise negative.   Objective:    BP 140/86 (BP Location: Left Arm, Patient Position: Sitting, Cuff Size: Normal)   Pulse 91   Temp 98.2 F (36.8 C) (Oral)   Wt 211 lb 12.8 oz (96.1 kg)   SpO2 98%   BMI 35.25 kg/m  General appearance: alert, cooperative and no distress Head: Normocephalic, without obvious abnormality, atraumatic Eyes: conjunctivae/corneas clear. PERRL, EOM's intact. Fundi benign. Ears: normal TM's  and external ear canals both ears Nose: Nares normal. Septum midline. Mucosa normal. No drainage or sinus tenderness. Throat: lips, mucosa, and tongue normal; teeth and gums normal Neck: no adenopathy, no carotid bruit, no JVD, supple, symmetrical, trachea midline and thyroid not enlarged, symmetric, no tenderness/mass/nodules Lungs: clear to auscultation bilaterally Heart: regular rate and rhythm, S1, S2 normal, no murmur, click, rub or gallop Abdomen: soft, non-tender; bowel sounds normal; no masses,  no organomegaly Extremities: extremities normal, atraumatic, no cyanosis or edema Pulses: 2+ and symmetric  MSK: Tenderness in right thumb with movement. Skin: Skin color, texture,  turgor normal. No rashes or lesions Lymph nodes: Cervical, supraclavicular, and axillary nodes normal. Neurologic: Alert and oriented X 3, normal strength and tone. Normal symmetric reflexes. Normal coordination and gait    Assessment:    Healthy female exam with increasing anxiety and depression symptoms.    Plan:     Anticipatory guidance given including wearing seatbelts, smoke detectors in the home, increasing physical activity, increasing p.o. intake of water and vegetables. -will obtain labs -Pap test done 09/25/16 with negative HPV testing.  Next pap due 2023 -Given handout -Next CPE in 1 year See After Visit Summary for Counseling Recommendations    Pain of right thumb  -likely tendonitis vs arthritis from overuse. -discussed supportive care -consider imaging for worsened or continued symptoms - Plan: CBC with Differential/Platelet, CBC with Differential/Platelet  Anxiety  -GAD-7 score 12 -Discussed restarting counseling/BH -We will restart Celexa 20 mg daily -Given precautions - Plan: TSH, T4, free, citalopram (CELEXA) 20 MG tablet, T4, free, TSH  Depression, recurrent (HCC) -PHQ-9 score 7 -Discussed restarting counseling/BH -Discussed restarting Celexa 20 mg daily -Self care advised -Given precautions  - Plan: TSH, T4, free, citalopram (CELEXA) 20 MG tablet, T4, free, TSH  F/u in 1 month, sooner if needed  Grier Mitts, MD

## 2020-03-22 ENCOUNTER — Encounter: Payer: Medicaid Other | Admitting: Family Medicine

## 2020-03-22 LAB — CBC WITH DIFFERENTIAL/PLATELET
Absolute Monocytes: 391 cells/uL (ref 200–950)
Basophils Absolute: 34 cells/uL (ref 0–200)
Basophils Relative: 0.4 %
Eosinophils Absolute: 170 cells/uL (ref 15–500)
Eosinophils Relative: 2 %
HCT: 39.8 % (ref 35.0–45.0)
Hemoglobin: 13.6 g/dL (ref 11.7–15.5)
Lymphs Abs: 2984 cells/uL (ref 850–3900)
MCH: 29.4 pg (ref 27.0–33.0)
MCHC: 34.2 g/dL (ref 32.0–36.0)
MCV: 86 fL (ref 80.0–100.0)
MPV: 11.7 fL (ref 7.5–12.5)
Monocytes Relative: 4.6 %
Neutro Abs: 4922 cells/uL (ref 1500–7800)
Neutrophils Relative %: 57.9 %
Platelets: 217 10*3/uL (ref 140–400)
RBC: 4.63 10*6/uL (ref 3.80–5.10)
RDW: 12.4 % (ref 11.0–15.0)
Total Lymphocyte: 35.1 %
WBC: 8.5 10*3/uL (ref 3.8–10.8)

## 2020-03-22 LAB — COMPLETE METABOLIC PANEL WITH GFR
AG Ratio: 1.5 (calc) (ref 1.0–2.5)
ALT: 14 U/L (ref 6–29)
AST: 17 U/L (ref 10–30)
Albumin: 4.2 g/dL (ref 3.6–5.1)
Alkaline phosphatase (APISO): 78 U/L (ref 31–125)
BUN: 9 mg/dL (ref 7–25)
CO2: 26 mmol/L (ref 20–32)
Calcium: 9.2 mg/dL (ref 8.6–10.2)
Chloride: 108 mmol/L (ref 98–110)
Creat: 0.83 mg/dL (ref 0.50–1.10)
GFR, Est African American: 104 mL/min/{1.73_m2} (ref >=60)
GFR, Est Non African American: 90 mL/min/{1.73_m2} (ref >=60)
Globulin: 2.8 g/dL (calc) (ref 1.9–3.7)
Glucose, Bld: 93 mg/dL (ref 65–99)
Potassium: 4 mmol/L (ref 3.5–5.3)
Sodium: 139 mmol/L (ref 135–146)
Total Bilirubin: 0.3 mg/dL (ref 0.2–1.2)
Total Protein: 7 g/dL (ref 6.1–8.1)

## 2020-03-22 LAB — HEMOGLOBIN A1C
Hgb A1c MFr Bld: 5.6 % of total Hgb (ref <5.7)
Mean Plasma Glucose: 114 mg/dL
eAG (mmol/L): 6.3 mmol/L

## 2020-03-22 LAB — LIPID PANEL
Cholesterol: 164 mg/dL (ref <200)
HDL: 38 mg/dL — ABNORMAL LOW (ref >=50)
LDL Cholesterol (Calc): 100 mg/dL (calc) — ABNORMAL HIGH
Non-HDL Cholesterol (Calc): 126 mg/dL (calc) (ref <130)
Total CHOL/HDL Ratio: 4.3 (calc) (ref <5.0)
Triglycerides: 163 mg/dL — ABNORMAL HIGH (ref <150)

## 2020-03-22 LAB — TSH: TSH: 1.93 mIU/L

## 2020-03-22 LAB — T4, FREE: Free T4: 1.1 ng/dL (ref 0.8–1.8)

## 2020-04-05 ENCOUNTER — Encounter: Payer: Self-pay | Admitting: Family Medicine

## 2020-04-28 DIAGNOSIS — Z03818 Encounter for observation for suspected exposure to other biological agents ruled out: Secondary | ICD-10-CM | POA: Diagnosis not present

## 2020-07-11 ENCOUNTER — Other Ambulatory Visit: Payer: Self-pay | Admitting: Family Medicine

## 2020-07-11 DIAGNOSIS — F339 Major depressive disorder, recurrent, unspecified: Secondary | ICD-10-CM

## 2020-07-11 DIAGNOSIS — F419 Anxiety disorder, unspecified: Secondary | ICD-10-CM

## 2020-10-08 ENCOUNTER — Other Ambulatory Visit: Payer: Self-pay | Admitting: Family Medicine

## 2020-10-08 DIAGNOSIS — F339 Major depressive disorder, recurrent, unspecified: Secondary | ICD-10-CM

## 2020-10-08 DIAGNOSIS — F419 Anxiety disorder, unspecified: Secondary | ICD-10-CM

## 2020-11-13 ENCOUNTER — Other Ambulatory Visit: Payer: Self-pay | Admitting: Family Medicine

## 2020-11-13 DIAGNOSIS — F339 Major depressive disorder, recurrent, unspecified: Secondary | ICD-10-CM

## 2020-11-13 DIAGNOSIS — F419 Anxiety disorder, unspecified: Secondary | ICD-10-CM

## 2020-11-13 MED ORDER — CITALOPRAM HYDROBROMIDE 20 MG PO TABS: 20.0000 mg | ORAL_TABLET | Freq: Every day | ORAL | 0 refills | Status: DC

## 2021-02-09 ENCOUNTER — Other Ambulatory Visit: Payer: Self-pay | Admitting: Family Medicine

## 2021-02-09 DIAGNOSIS — F419 Anxiety disorder, unspecified: Secondary | ICD-10-CM

## 2021-02-09 DIAGNOSIS — F339 Major depressive disorder, recurrent, unspecified: Secondary | ICD-10-CM

## 2021-03-28 ENCOUNTER — Encounter: Payer: BC Managed Care – PPO | Admitting: Family Medicine

## 2021-04-03 ENCOUNTER — Ambulatory Visit (INDEPENDENT_AMBULATORY_CARE_PROVIDER_SITE_OTHER): Payer: BC Managed Care – PPO | Admitting: Family Medicine

## 2021-04-03 ENCOUNTER — Encounter: Payer: Self-pay | Admitting: Family Medicine

## 2021-04-03 VITALS — BP 132/96 | HR 80 | Temp 98.6°F | Wt 195.2 lb

## 2021-04-03 DIAGNOSIS — K047 Periapical abscess without sinus: Secondary | ICD-10-CM

## 2021-04-03 DIAGNOSIS — E782 Mixed hyperlipidemia: Secondary | ICD-10-CM

## 2021-04-03 DIAGNOSIS — J069 Acute upper respiratory infection, unspecified: Secondary | ICD-10-CM | POA: Diagnosis not present

## 2021-04-03 DIAGNOSIS — Z0000 Encounter for general adult medical examination without abnormal findings: Secondary | ICD-10-CM | POA: Diagnosis not present

## 2021-04-03 DIAGNOSIS — I10 Essential (primary) hypertension: Secondary | ICD-10-CM | POA: Diagnosis not present

## 2021-04-03 DIAGNOSIS — J040 Acute laryngitis: Secondary | ICD-10-CM | POA: Diagnosis not present

## 2021-04-03 LAB — CBC WITH DIFFERENTIAL/PLATELET
Basophils Absolute: 0 10*3/uL (ref 0.0–0.1)
Basophils Relative: 0.3 % (ref 0.0–3.0)
Eosinophils Absolute: 0.1 10*3/uL (ref 0.0–0.7)
Eosinophils Relative: 1.3 % (ref 0.0–5.0)
HCT: 38.3 % (ref 36.0–46.0)
Hemoglobin: 12.7 g/dL (ref 12.0–15.0)
Lymphocytes Relative: 18.6 % (ref 12.0–46.0)
Lymphs Abs: 1.8 10*3/uL (ref 0.7–4.0)
MCHC: 33.2 g/dL (ref 30.0–36.0)
MCV: 88 fl (ref 78.0–100.0)
Monocytes Absolute: 0.5 10*3/uL (ref 0.1–1.0)
Monocytes Relative: 5.5 % (ref 3.0–12.0)
Neutro Abs: 7.1 10*3/uL (ref 1.4–7.7)
Neutrophils Relative %: 74.3 % (ref 43.0–77.0)
Platelets: 331 10*3/uL (ref 150.0–400.0)
RBC: 4.35 Mil/uL (ref 3.87–5.11)
RDW: 13.7 % (ref 11.5–15.5)
WBC: 9.6 10*3/uL (ref 4.0–10.5)

## 2021-04-03 LAB — BASIC METABOLIC PANEL
BUN: 7 mg/dL (ref 6–23)
CO2: 31 mEq/L (ref 19–32)
Calcium: 9.3 mg/dL (ref 8.4–10.5)
Chloride: 104 mEq/L (ref 96–112)
Creatinine, Ser: 0.76 mg/dL (ref 0.40–1.20)
GFR: 99.15 mL/min (ref >60.00)
Glucose, Bld: 83 mg/dL (ref 70–99)
Potassium: 4 mEq/L (ref 3.5–5.1)
Sodium: 139 mEq/L (ref 135–145)

## 2021-04-03 LAB — POCT RAPID STREP A (OFFICE): Rapid Strep A Screen: NEGATIVE

## 2021-04-03 LAB — HEMOGLOBIN A1C: Hgb A1c MFr Bld: 5.5 % (ref 4.6–6.5)

## 2021-04-03 LAB — LIPID PANEL
Cholesterol: 135 mg/dL (ref 0–200)
HDL: 38.3 mg/dL — ABNORMAL LOW (ref >39.00)
LDL Cholesterol: 76 mg/dL (ref 0–99)
NonHDL: 96.42
Total CHOL/HDL Ratio: 4
Triglycerides: 100 mg/dL (ref 0.0–149.0)
VLDL: 20 mg/dL (ref 0.0–40.0)

## 2021-04-03 LAB — POC COVID19 BINAXNOW: SARS Coronavirus 2 Ag: NEGATIVE

## 2021-04-03 MED ORDER — HYDROCHLOROTHIAZIDE 12.5 MG PO TABS: 12.5000 mg | ORAL_TABLET | Freq: Every day | ORAL | 3 refills | Status: DC

## 2021-04-03 MED ORDER — AMOXICILLIN 500 MG PO TABS: 500.0000 mg | ORAL_TABLET | Freq: Two times a day (BID) | ORAL | 0 refills | Status: AC

## 2021-04-03 NOTE — Patient Instructions (Signed)
A prescription for hydrochlorothiazide 12.5 mg daily was sent to your pharmacy.  This is a medication to help with your blood pressure.  We will need to have you come back in 1 month to recheck your blood pressure and see how you are doing on the medication.  If you feel like you are having side effects or issues with taking the medication prior to then let us know.  Her prescription was also sent to your pharmacy for amoxicillin to help treat your dental infection.  Keep your appointment with your dentist next week.

## 2021-04-03 NOTE — Progress Notes (Signed)
Subjective:     Sherry [name] is a 40 y.o. female and is here for a comprehensive physical exam. The patient reports rhinorrhea, cough, sore throat, laryngitis x1 day.  Patient states her daughter was recently sick with a cold.  Patient also notes edema of right jaw/side of face 2/2 dental abscess.  Patient had an appointment with a dentist today but was afraid she would miss this appointment.  Plans to see a dentist next week.  States pain from the abscess has come down from over the weekend.  Was taking Tylenol to help with symptoms.  Patient inquires about influenza vaccine.  Patient states she has been busy working 2 jobs currently.  Social History   Socioeconomic History   Marital status: Married    Spouse name: Not on file   Number of children: Not on file   Years of education: Not on file   Highest education level: Not on file  Occupational History   Not on file  Tobacco Use   Smoking status: Never   Smokeless tobacco: Never  Substance and Sexual Activity   Alcohol use: No   Drug use: No   Sexual activity: Never    Partners: Male    Birth control/protection: Surgical  Other Topics Concern   Not on file  Social History Narrative         Social Determinants of Health   Financial Resource Strain: Not on file  Food Insecurity: Not on file  Transportation Needs: Not on file  Physical Activity: Not on file  Stress: Not on file  Social Connections: Not on file  Intimate Partner Violence: Not on file   Health Maintenance  Topic Date Due   Hepatitis C Screening  Never done   PAP SMEAR-Modifier  09/26/2019   COVID-19 Vaccine (3 - Booster for Pfizer series) 10/24/2019   INFLUENZA VACCINE  11/12/2020   TETANUS/TDAP  04/04/2024   HIV Screening  Completed   Pneumococcal Vaccine 22-21 Years old  Aged Out   HPV VACCINES  Aged Out    The following portions of the patient's history were reviewed and updated as appropriate: allergies, current medications, past family  history, past medical history, past social history, past surgical history, and problem list.  Review of Systems Pertinent items noted in HPI and remainder of comprehensive ROS otherwise negative.   Objective:    BP (!) 132/96 (BP Location: Left Arm, Patient Position: Sitting, Cuff Size: Normal)    Pulse 80    Temp 98.6 F (37 C) (Oral)    Wt 195 lb 3.2 oz (88.5 kg)    SpO2 97%    BMI 32.48 kg/m  General appearance: alert, cooperative, and no distress Head: Normocephalic, without obvious abnormality, atraumatic Eyes: conjunctivae/corneas clear. PERRL, EOM's intact. Fundi benign. Ears: normal TM's and external ear canals both ears Nose: Nares normal. Septum midline. Mucosa with erythema and mildly boggy, clear drainage, no sinus tenderness. Throat: lips, mucosa, and tongue normal; poor dentition, dental caries noted.  Mild Edema of right mandibular area without TTP.  Mallampati score 4.  Erythema pharynx. Neck: no adenopathy, no carotid bruit, no JVD, supple, symmetrical, trachea midline, and thyroid not enlarged, symmetric, no tenderness/mass/nodules Lungs: clear to auscultation bilaterally Heart: regular rate and rhythm, S1, S2 normal, no murmur, click, rub or gallop Abdomen: soft, non-tender; bowel sounds normal; no masses,  no organomegaly Extremities: extremities normal, atraumatic, no cyanosis or edema Pulses: 2+ and symmetric Skin: Skin color, texture, turgor normal. No rashes or lesions  Lymph nodes: Cervical, supraclavicular, and axillary nodes normal. Neurologic: Alert and oriented X 3, normal strength and tone. Normal symmetric reflexes. Normal coordination and gait    Assessment:   Pt is a 40 year old female seen for CPE/sick visit with viral URI symptoms, dental abscess and continued elevation in BP.   Plan:    Anticipatory guidance given including wearing seatbelts, smoke detectors in the home, increasing physical activity, increasing p.o. intake of water and  vegetables. -We will obtain labs -Discussed waiting on immunizations (influenza vaccine) until current symptoms resolved. -Mammogram not yet indicated 2/2 age -Colonoscopy not yet indicated 2/2 age -Pap with OB/GYN -Given handout -Next CPE in 1 year See After Visit Summary for Counseling Recommendations   Essential hypertension -Elevated -Per chart review continued elevation in BP.  Patient also confirms elevation in BP at other appointments. -Given his continued elevation will start hydrochlorothiazide 12.5 mg daily -Lifestyle modifications -Given handout - Plan: Basic metabolic panel  Dental abscess -Patient encouraged to keep follow-up appointment with her dental provider next week -Given precautions - Plan: amoxicillin (AMOXIL) 500 MG tablet, CBC with Differential/Platelet  Viral URI with cough -Supportive care OTC cough/cold medications, gargling with warm salt water Chloraseptic spray, rest, etc. -POC rapid strep and COVID-negative - Plan: POC Rapid Strep A, POC COVID-19  Laryngitis -Supportive care including gargling with warm salt water or Chloraseptic spray -Vocal rest as needed -OTC cough/cold medications as needed - Plan: POC Rapid Strep A, POC COVID-19  Mixed hyperlipidemia -Lifestyle modification - Plan: Lipid panel  F/u in 1 months for HTN  Grier Mitts, MD

## 2021-05-13 ENCOUNTER — Ambulatory Visit: Payer: BC Managed Care – PPO | Admitting: Family Medicine

## 2021-05-17 ENCOUNTER — Ambulatory Visit (INDEPENDENT_AMBULATORY_CARE_PROVIDER_SITE_OTHER): Payer: BC Managed Care – PPO | Admitting: Family Medicine

## 2021-05-17 ENCOUNTER — Encounter: Payer: Self-pay | Admitting: Family Medicine

## 2021-05-17 VITALS — BP 124/86 | HR 78 | Temp 98.4°F | Ht 65.0 in | Wt 190.4 lb

## 2021-05-17 DIAGNOSIS — F419 Anxiety disorder, unspecified: Secondary | ICD-10-CM | POA: Diagnosis not present

## 2021-05-17 DIAGNOSIS — I10 Essential (primary) hypertension: Secondary | ICD-10-CM

## 2021-05-17 DIAGNOSIS — F339 Major depressive disorder, recurrent, unspecified: Secondary | ICD-10-CM

## 2021-05-17 MED ORDER — HYDROCHLOROTHIAZIDE 12.5 MG PO TABS: 12.5000 mg | ORAL_TABLET | Freq: Every day | ORAL | 3 refills | Status: DC

## 2021-05-17 NOTE — Progress Notes (Signed)
Subjective:    Patient ID: Sherry [name], female    DOB: 10-Mar-1981, 41 y.o.   MRN: [id number]  No chief complaint on file.   HPI Patient was seen today for f/u.  Pt notes improvement in bp since starting HCTZ 12.5 mg daily.  Has noticed slight increase in urination.  Also taking Celexa 20 mg daily.  States feels calmer since starting the blood pressure medication.  Patient denies headaches, CP, changes in vision.  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   Severe anemia 06/06/2015   Shortness of breath dyspnea    with exertion    No Known Allergies  ROS General: Denies fever, chills, night sweats, changes in weight, changes in appetite HEENT: Denies headaches, ear pain, changes in vision, rhinorrhea, sore throat CV: Denies CP, palpitations, SOB, orthopnea Pulm: Denies SOB, cough, wheezing GI: Denies abdominal pain, nausea, vomiting, diarrhea, constipation GU: Denies dysuria, hematuria, frequency, vaginal discharge Msk: Denies muscle cramps, joint pains Neuro: Denies weakness, numbness, tingling Skin: Denies rashes, bruising Psych: Denies depression, anxiety, hallucinations     Objective:    Blood pressure 128/90, pulse 78, temperature 98.4 F (36.9 C), temperature source Oral, height 5\' 5"  (1.651 m), weight 190 lb 6.4 oz (86.4 kg), SpO2 99 %.  Initial BP 128/90  Gen. Pleasant, well-nourished, in no distress, normal affect   HEENT: [place]/AT, face symmetric, conjunctiva clear, no scleral icterus, PERRLA, EOMI, nares patent without drainage Lungs: no accessory muscle use, CTAB, no wheezes or rales Cardiovascular: RRR, no m/r/g, no peripheral edema Musculoskeletal: No deformities, no cyanosis or clubbing, normal tone Neuro:  A&Ox3, CN II-XII intact, normal gait Skin:  Warm, no lesions/ rash   Wt Readings from Last 3  Encounters:  05/17/21 190 lb 6.4 oz (86.4 kg)  04/03/21 195 lb 3.2 oz (88.5 kg)  03/21/20 211 lb 12.8 oz (96.1 kg)    Lab Results  Component Value Date   WBC 9.6 04/03/2021   HGB 12.7 04/03/2021   HCT 38.3 04/03/2021   PLT 331.0 04/03/2021   GLUCOSE 83 04/03/2021   CHOL 135 04/03/2021   TRIG 100.0 04/03/2021   HDL 38.30 (L) 04/03/2021   LDLCALC 76 04/03/2021   ALT 14 03/21/2020   AST 17 03/21/2020   NA 139 04/03/2021   K 4.0 04/03/2021   CL 104 04/03/2021   CREATININE 0.76 04/03/2021   BUN 7 04/03/2021   CO2 31 04/03/2021   TSH 1.93 03/21/2020   INR 1.22 06/06/2015   HGBA1C 5.5 04/03/2021    Assessment/Plan:  Essential hypertension  -Controlled -Continue hydrochlorothiazide 12.5 mg daily -Continue lifestyle modifications and increase p.o. intake of water fluids -Continue to monitor BP at home and keep a log to bring with you to clinic - Plan: hydrochlorothiazide (HYDRODIURIL) 12.5 MG tablet  Anxiety -Stable -Continue Celexa 20 mg daily -Counseling  Depression, recurrent (HCC) -Stable -Continue Celexa 20 mg daily -Counseling  F/u in 3 months, sooner if needed for HTN  Grier Mitts, MD

## 2021-09-30 ENCOUNTER — Other Ambulatory Visit: Payer: Self-pay | Admitting: Family Medicine

## 2021-09-30 DIAGNOSIS — F339 Major depressive disorder, recurrent, unspecified: Secondary | ICD-10-CM

## 2021-09-30 DIAGNOSIS — F419 Anxiety disorder, unspecified: Secondary | ICD-10-CM

## 2021-10-14 ENCOUNTER — Ambulatory Visit: Payer: BC Managed Care – PPO | Admitting: Family Medicine

## 2021-10-21 ENCOUNTER — Encounter: Payer: Self-pay | Admitting: Family Medicine

## 2021-10-21 ENCOUNTER — Ambulatory Visit (INDEPENDENT_AMBULATORY_CARE_PROVIDER_SITE_OTHER): Payer: BC Managed Care – PPO | Admitting: Family Medicine

## 2021-10-21 VITALS — BP 128/88 | HR 86 | Temp 99.1°F | Wt 198.0 lb

## 2021-10-21 DIAGNOSIS — F411 Generalized anxiety disorder: Secondary | ICD-10-CM

## 2021-10-21 DIAGNOSIS — I10 Essential (primary) hypertension: Secondary | ICD-10-CM

## 2021-10-21 MED ORDER — CITALOPRAM HYDROBROMIDE 40 MG PO TABS: 40.0000 mg | ORAL_TABLET | Freq: Every day | ORAL | 3 refills | Status: DC

## 2021-10-21 MED ORDER — CITALOPRAM HYDROBROMIDE 40 MG PO TABS: 40.0000 mg | ORAL_TABLET | Freq: Every day | ORAL | 1 refills | Status: DC

## 2021-10-21 NOTE — Progress Notes (Signed)
Subjective:    Patient ID: Sherry [name], female    DOB: 12-16-1980, 41 y.o.   MRN: [id number]  Chief Complaint  Patient presents with   Hypertension    HPI Patient was seen today for f/u.  Pt states taking HCTZ 12.5 mg without issue for HTN.  Hasn't checked bp recently.  Denies HAs, LE edema, or changes in vision.  Pt endorses intermittent CP.  States notices it with increased anxiety.  Pt endorses increased stress at home and at work.  Pt notes the recent, unexpected death of her brother and another family member.  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   Severe anemia 06/06/2015   Shortness of breath dyspnea    with exertion    No Known Allergies  ROS General: Denies fever, chills, night sweats, changes in weight, changes in appetite HEENT: Denies headaches, ear pain, changes in vision, rhinorrhea, sore throat CV: Denies CP, palpitations, SOB, orthopnea  + intermittent CP Pulm: Denies SOB, cough, wheezing GI: Denies abdominal pain, nausea, vomiting, diarrhea, constipation GU: Denies dysuria, hematuria, frequency, vaginal discharge Msk: Denies muscle cramps, joint pains Neuro: Denies weakness, numbness, tingling Skin: Denies rashes, bruising Psych: Denies depression, hallucinations  +anxiety    Objective:    Blood pressure 128/88, pulse 86, temperature 99.1 F (37.3 C), temperature source Oral, weight 198 lb (89.8 kg), SpO2 97 %.  Gen. Pleasant, well-nourished, in no distress, normal affect   HEENT: [place]/AT, face symmetric, conjunctiva clear, no scleral icterus, PERRLA, EOMI, nares patent without drainage Lungs: no accessory muscle use, CTAB, no wheezes or rales Cardiovascular: RRR, no m/r/g, no peripheral edema Musculoskeletal: No deformities, no cyanosis or clubbing, normal tone Neuro:  A&Ox3, CN II-XII intact,  normal gait Skin:  Warm, no lesions/ rash   Wt Readings from Last 3 Encounters:  10/21/21 198 lb (89.8 kg)  05/17/21 190 lb 6.4 oz (86.4 kg)  04/03/21 195 lb 3.2 oz (88.5 kg)    Lab Results  Component Value Date   WBC 9.6 04/03/2021   HGB 12.7 04/03/2021   HCT 38.3 04/03/2021   PLT 331.0 04/03/2021   GLUCOSE 83 04/03/2021   CHOL 135 04/03/2021   TRIG 100.0 04/03/2021   HDL 38.30 (L) 04/03/2021   LDLCALC 76 04/03/2021   ALT 14 03/21/2020   AST 17 03/21/2020   NA 139 04/03/2021   K 4.0 04/03/2021   CL 104 04/03/2021   CREATININE 0.76 04/03/2021   BUN 7 04/03/2021   CO2 31 04/03/2021   TSH 1.93 03/21/2020   INR 1.22 06/06/2015   HGBA1C 5.5 04/03/2021      10/21/2021    4:14 PM 08/10/2017   11:00 AM 01/08/2017    2:59 PM 09/25/2016    2:30 PM  GAD 7 : Generalized Anxiety Score  Nervous, Anxious, on Edge '3 2 3 3  ' Control/stop worrying '3 2 3 3  ' Worry too much - different things '3 3 3 3  ' Trouble relaxing '1 1 1 1  ' Restless 0 0 1 0  Easily annoyed or irritable 0 '1 2 2  ' Afraid - awful might happen 1 0 0 2  Total GAD 7 Score '11 9 13 14  ' Anxiety Difficulty Somewhat difficult          10/21/2021    3:32 PM 05/17/2021  3:34 PM 04/03/2021   11:08 AM  Depression screen PHQ 2/9  Decreased Interest 0 0 0  Down, Depressed, Hopeless 1 1 0  PHQ - 2 Score 1 1 0  Altered sleeping 0 0 0  Tired, decreased energy 0 1 1  Change in appetite 0 0 0  Feeling bad or failure about yourself  '1 2 2  ' Trouble concentrating 0 0 0  Moving slowly or fidgety/restless 0 0 0  Suicidal thoughts 1 1 0  PHQ-9 Score '3 5 3  ' Difficult doing work/chores Not difficult at all      Assessment/Plan:  GAD (generalized anxiety disorder)  -GAD 7 score 11 -discussed increasing dose of Celexa from 20 to 30 mg daily.   -given info for other counseling options in the area. -given handouts - Plan: citalopram (CELEXA) 40 MG tablet  Essential hypertension -controlled -continue lifestyle  modifications -continue HCTZ 12.5 mg daily. -Creatinine 0.76 and EGFR 91.15 on 04/03/2021-continue to monitor BP  F/u in 4-6 wks, sooner if needed  Grier Mitts, MD

## 2021-12-02 ENCOUNTER — Ambulatory Visit: Payer: BC Managed Care – PPO | Admitting: Family Medicine

## 2022-01-23 ENCOUNTER — Ambulatory Visit (INDEPENDENT_AMBULATORY_CARE_PROVIDER_SITE_OTHER): Payer: BC Managed Care – PPO | Admitting: Family Medicine

## 2022-01-23 ENCOUNTER — Other Ambulatory Visit (HOSPITAL_COMMUNITY): Payer: Self-pay

## 2022-01-23 ENCOUNTER — Encounter: Payer: Self-pay | Admitting: Family Medicine

## 2022-01-23 VITALS — BP 116/88 | HR 73 | Temp 98.0°F | Wt 202.2 lb

## 2022-01-23 DIAGNOSIS — F411 Generalized anxiety disorder: Secondary | ICD-10-CM | POA: Diagnosis not present

## 2022-01-23 DIAGNOSIS — I10 Essential (primary) hypertension: Secondary | ICD-10-CM | POA: Diagnosis not present

## 2022-01-23 DIAGNOSIS — N926 Irregular menstruation, unspecified: Secondary | ICD-10-CM

## 2022-01-23 MED ORDER — HYDROCHLOROTHIAZIDE 12.5 MG PO TABS
12.5000 mg | ORAL_TABLET | Freq: Every day | ORAL | 3 refills | Status: DC
  Filled 2022-01-23 – 2022-02-12 (×2): qty 90, 90d supply, fill #0
  Filled 2022-05-15 – 2022-05-27 (×2): qty 90, 90d supply, fill #1
  Filled 2022-08-29: qty 90, 90d supply, fill #2
  Filled 2022-12-03: qty 90, 90d supply, fill #3

## 2022-01-23 MED ORDER — CITALOPRAM HYDROBROMIDE 40 MG PO TABS
40.0000 mg | ORAL_TABLET | Freq: Every day | ORAL | 1 refills | Status: DC
  Filled 2022-01-23 – 2022-02-12 (×2): qty 90, 90d supply, fill #0
  Filled 2022-05-15 – 2022-05-27 (×2): qty 90, 90d supply, fill #1

## 2022-01-23 NOTE — Progress Notes (Signed)
Subjective:    Patient ID: Sherry [name], female    DOB: 09/07/1980, 41 y.o.   MRN: [id number]  Chief Complaint  Patient presents with   Anxiety    Increased Celexa, states it makes her sleepy, but just depends on the situation.     HPI Patient is a 41 yo female with pmh sig for anxiety, depression, GERD, h/o anemia who was seen today for f/u on anxiety and depression.  Pt taking celexa 40 mg q am at 7 am.  States unsure if tired from work at the end of the day or from medication.  Patient started back working at [employer] in [place].  Pt notes overall improvement in mood.    Pt taking HCTZ 12.5 mg daily for HTN.  Patient plans to schedule pap.  Has h/o irregular menses status post hysteroscopy.  Endorses spotting x2 days, heavy bleeding x1 day, then spotting for 2 days with menses.  Patient also notes intense cramping with menses.   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   Severe anemia 06/06/2015   Shortness of breath dyspnea    with exertion    No Known Allergies  ROS General: Denies fever, chills, night sweats, changes in weight, changes in appetite HEENT: Denies headaches, ear pain, changes in vision, rhinorrhea, sore throat CV: Denies CP, palpitations, SOB, orthopnea Pulm: Denies SOB, cough, wheezing GI: Denies abdominal pain, nausea, vomiting, diarrhea, constipation GU: Denies dysuria, hematuria, frequency, vaginal discharge  + irregular menses Msk: Denies muscle cramps, joint pains Neuro: Denies weakness, numbness, tingling Skin: Denies rashes, bruising Psych: Denies hallucinations + anxiety, depression     Objective:    Blood pressure 116/88, pulse 73, temperature 98 F (36.7 C), temperature source Oral, weight 202 lb 3.2 oz (91.7 kg), SpO2 98 %.  Gen. Pleasant, well-nourished, in no distress, normal  affect   HEENT: [place]/AT, face symmetric, conjunctiva clear, no scleral icterus, PERRLA, EOMI, nares patent without drainage Lungs: no accessory muscle use Cardiovascular: RRR, no peripheral edema Neuro:  A&Ox3, CN II-XII intact, normal gait Skin:  Warm, no lesions/ rash   Wt Readings from Last 3 Encounters:  01/23/22 202 lb 3.2 oz (91.7 kg)  10/21/21 198 lb (89.8 kg)  05/17/21 190 lb 6.4 oz (86.4 kg)    Lab Results  Component Value Date   WBC 9.6 04/03/2021   HGB 12.7 04/03/2021   HCT 38.3 04/03/2021   PLT 331.0 04/03/2021   GLUCOSE 83 04/03/2021   CHOL 135 04/03/2021   TRIG 100.0 04/03/2021   HDL 38.30 (L) 04/03/2021   LDLCALC 76 04/03/2021   ALT 14 03/21/2020   AST 17 03/21/2020   NA 139 04/03/2021   K 4.0 04/03/2021   CL 104 04/03/2021   CREATININE 0.76 04/03/2021   BUN 7 04/03/2021   CO2 31 04/03/2021   TSH 1.93 03/21/2020   INR 1.22 06/06/2015   HGBA1C 5.5 04/03/2021      01/23/2022    9:42 AM 10/21/2021    3:32 PM 05/17/2021    3:34 PM  Depression screen PHQ 2/9  Decreased Interest 0 0 0  Down, Depressed, Hopeless '1 1 1  '$ PHQ - 2 Score '1 1 1  '$ Altered sleeping 0 0 0  Tired, decreased energy 1 0 1  Change in appetite 0 0 0  Feeling  bad or failure about yourself  0 1 2  Trouble concentrating 0 0 0  Moving slowly or fidgety/restless 0 0 0  Suicidal thoughts 0 1 1  PHQ-9 Score '2 3 5  '$ Difficult doing work/chores Somewhat difficult Not difficult at all       01/23/2022    9:42 AM 10/21/2021    4:14 PM 08/10/2017   11:00 AM 01/08/2017    2:59 PM  GAD 7 : Generalized Anxiety Score  Nervous, Anxious, on Edge '2 3 2 3  '$ Control/stop worrying '3 3 2 3  '$ Worry too much - different things '2 3 3 3  '$ Trouble relaxing '1 1 1 1  '$ Restless 0 0 0 1  Easily annoyed or irritable 0 0 1 2  Afraid - awful might happen 1 1 0 0  Total GAD 7 Score '9 11 9 13  '$ Anxiety Difficulty Somewhat difficult Somewhat difficult       Assessment/Plan:  GAD (generalized anxiety  disorder) -Stable -GAD-7 score 9 this visit.  Previously 11. -Continue Celexa 40 mg daily -Consider counseling -Discussed self-care -Given strict precautions  - Plan: citalopram (CELEXA) 40 MG tablet  Essential hypertension  -Controlled -Continue hydrochlorothiazide 12.5 mg daily -Continue lifestyle modifications - Plan: hydrochlorothiazide (HYDRODIURIL) 12.5 MG tablet  Irregular menses -Noted status post hysteroscopy -We will obtain Pap next few weeks during CPE -If needed continue follow-up with OB/GYN  F/u as needed in the next few months for CPE and Pap  Grier Mitts, MD

## 2022-01-31 ENCOUNTER — Other Ambulatory Visit (HOSPITAL_COMMUNITY): Payer: Self-pay

## 2022-02-09 ENCOUNTER — Encounter (HOSPITAL_COMMUNITY): Payer: Self-pay | Admitting: Emergency Medicine

## 2022-02-09 ENCOUNTER — Ambulatory Visit (HOSPITAL_COMMUNITY)
Admission: EM | Admit: 2022-02-09 | Discharge: 2022-02-09 | Disposition: A | Discharge status: Home / Self Care | Payer: 59

## 2022-02-09 DIAGNOSIS — J040 Acute laryngitis: Secondary | ICD-10-CM

## 2022-02-09 NOTE — ED Provider Notes (Signed)
[place]    CSN: [id number] Arrival date & time: 02/09/22  1312      History   Chief Complaint Chief Complaint  Patient presents with   Hoarse   Cough    HPI Sherry [name] is a 41 y.o. female.   Patient complains of losing her voice.  Patient reports she has had congestion and drainage for the past week.  Patient reports she was out of work yesterday.  Patient denies any fever or chills she denies any facial pain she is not have any difficulty breathing.   Cough Cough characteristics:  Non-productive Severity:  Mild Onset quality:  Gradual Smoker: no   Relieved by:  Nothing   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   Severe anemia 06/06/2015   Shortness of breath dyspnea    with exertion    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   Depression, recurrent ([place]) 01/22/2017   Abnormal uterine bleeding (AUB) 06/27/2015   Severe anemia 06/06/2015   Symptomatic anemia 06/06/2015   Status post repeat low transverse cesarean section 04/03/2014   ASCUS with positive high risk HPV 10/19/2012   Yeast dermatitis 10/19/2012    Past Surgical History:  Procedure Laterality Date   BARTHOLIN CYST MARSUPIALIZATION     CESAREAN SECTION     x2   CESAREAN SECTION WITH BILATERAL TUBAL LIGATION N/A 04/03/2014   Procedure: CESAREAN SECTION WITH BILATERAL TUBAL LIGATION;  Surgeon: Jerelyn Charles, MD;  Location: [facility];  Service: Obstetrics;  Laterality: N/A;   DENTAL SURGERY  2010   6 teeth surgically removed   HYSTEROSCOPY WITH NOVASURE N/A 06/27/2015   Procedure: HYSTEROSCOPY WITH NOVASURE;  Surgeon: Lavonia Drafts, MD;  Location: [facility];  Service: Gynecology;  Laterality: N/A;     OB History     Gravida  3   Para  3   Term  3   Preterm      AB      Living  2      SAB      IAB      Ectopic      Multiple  0   Live Births  3            Home Medications    Prior to Admission medications   Medication Sig Start Date End Date Taking? Authorizing Provider  Acetaminophen-Caff-Pyrilamine 144-31-54 MG TABS Take 2 tablets by mouth every 8 (eight) hours as needed (cramping). No more than 6 tablets per day 04/14/19   Wieters, Hallie C, PA-C  citalopram (CELEXA) 40 MG tablet Take 1 tablet (40 mg total) by mouth daily. 01/23/22   Billie Ruddy, MD  hydrochlorothiazide (HYDRODIURIL) 12.5 MG tablet Take 1 tablet (12.5 mg total) by mouth daily. 01/23/22   Billie Ruddy, MD  Multiple Vitamins-Minerals (ALIVE WOMENS GUMMY) CHEW Chew 1 each by mouth daily.    [provider]    Family History Family History  Problem Relation Age of Onset   Diabetes Mother        Type 2   Hypertension Mother    Depression Father    Diabetes Maternal Aunt    Hypertension Maternal Aunt    Hypertension Maternal Aunt  Social History Social History   Tobacco Use   Smoking status: Never   Smokeless tobacco: Never  Substance Use Topics   Alcohol use: No   Drug use: No     Allergies   Patient has no known allergies.   Review of Systems Review of Systems  Respiratory:  Positive for cough.   All other systems reviewed and are negative.    Physical Exam Triage Vital Signs ED Triage Vitals  Enc Vitals Group     BP 02/09/22 1431 128/84     Pulse Rate 02/09/22 1431 71     Resp 02/09/22 1431 18     Temp 02/09/22 1431 97.6 F (36.4 C)     Temp Source 02/09/22 1431 Oral     SpO2 02/09/22 1431 97 %     Weight --      Height --      Head Circumference --      Peak Flow --      Pain Score 02/09/22 1430 0     Pain Loc --      Pain Edu? --      Excl. in [place]? --    No data found.  Updated Vital Signs BP 128/84 (BP Location: Right  Arm)   Pulse 71   Temp 97.6 F (36.4 C) (Oral)   Resp 18   SpO2 97%   Visual Acuity Right Eye Distance:   Left Eye Distance:   Bilateral Distance:    Right Eye Near:   Left Eye Near:    Bilateral Near:     Physical Exam Vitals and nursing note reviewed.  Constitutional:      Appearance: She is well-developed.  HENT:     Head: Normocephalic.  Cardiovascular:     Rate and Rhythm: Normal rate.  Pulmonary:     Effort: Pulmonary effort is normal.  Abdominal:     General: There is no distension.  Musculoskeletal:        General: Normal range of motion.     Cervical back: Normal range of motion.  Neurological:     General: No focal deficit present.     Mental Status: She is alert and oriented to person, place, and time.  Psychiatric:        Mood and Affect: Mood normal.      UC Treatments / Results  Labs (all labs ordered are listed, but only abnormal results are displayed) Labs Reviewed - No data to display  EKG   Radiology No results found.  Procedures Procedures (including critical care time)  Medications Ordered in UC Medications - No data to display  Initial Impression / Assessment and Plan / UC Course  I have reviewed the triage vital signs and the nursing notes.  Pertinent labs & imaging results that were available during my care of the patient were reviewed by me and considered in my medical decision making (see chart for details).      Final Clinical Impressions(s) / UC Diagnoses   Final diagnoses:  Laryngitis     Discharge Instructions      Try Cepacol lozenges.  Try taking an allergy medicine such as Zyrtec Allegra or Claritin.  Try not to clear your throat.   ED Prescriptions   None    PDMP not reviewed this encounter.   Fransico Meadow, [place] 02/09/22 1543

## 2022-02-09 NOTE — ED Triage Notes (Signed)
Pt reports runny nose x 1 week, hoarse and cough x 2 days. Has been taking alka seltzer.

## 2022-02-09 NOTE — Discharge Instructions (Addendum)
Try Cepacol lozenges.  Try taking an allergy medicine such as Zyrtec Allegra or Claritin.  Try not to clear your throat.

## 2022-02-12 ENCOUNTER — Other Ambulatory Visit (HOSPITAL_COMMUNITY): Payer: Self-pay

## 2022-03-12 ENCOUNTER — Other Ambulatory Visit: Payer: Self-pay

## 2022-03-12 ENCOUNTER — Emergency Department (HOSPITAL_COMMUNITY): Payer: 59

## 2022-03-12 ENCOUNTER — Emergency Department (HOSPITAL_COMMUNITY)
Admission: EM | Admit: 2022-03-12 | Discharge: 2022-03-12 | Disposition: A | Discharge status: Home / Self Care | Payer: 59 | Attending: Emergency Medicine | Admitting: Emergency Medicine

## 2022-03-12 ENCOUNTER — Encounter (HOSPITAL_COMMUNITY): Payer: Self-pay | Admitting: Emergency Medicine

## 2022-03-12 DIAGNOSIS — R079 Chest pain, unspecified: Secondary | ICD-10-CM | POA: Diagnosis not present

## 2022-03-12 DIAGNOSIS — R102 Pelvic and perineal pain: Secondary | ICD-10-CM | POA: Diagnosis present

## 2022-03-12 DIAGNOSIS — D259 Leiomyoma of uterus, unspecified: Secondary | ICD-10-CM | POA: Insufficient documentation

## 2022-03-12 DIAGNOSIS — R1031 Right lower quadrant pain: Secondary | ICD-10-CM | POA: Diagnosis not present

## 2022-03-12 DIAGNOSIS — R55 Syncope and collapse: Secondary | ICD-10-CM | POA: Diagnosis not present

## 2022-03-12 DIAGNOSIS — N8003 Adenomyosis of the uterus: Secondary | ICD-10-CM | POA: Diagnosis not present

## 2022-03-12 DIAGNOSIS — R109 Unspecified abdominal pain: Secondary | ICD-10-CM | POA: Diagnosis not present

## 2022-03-12 LAB — COMPREHENSIVE METABOLIC PANEL
ALT: 12 U/L (ref 0–44)
AST: 17 U/L (ref 15–41)
Albumin: 4.1 g/dL (ref 3.5–5.0)
Alkaline Phosphatase: 61 U/L (ref 38–126)
Anion gap: 7 (ref 5–15)
BUN: 13 mg/dL (ref 6–20)
CO2: 27 mmol/L (ref 22–32)
Calcium: 9 mg/dL (ref 8.9–10.3)
Chloride: 104 mmol/L (ref 98–111)
Creatinine, Ser: 0.9 mg/dL (ref 0.44–1.00)
GFR, Estimated: >60 mL/min (ref >60)
Glucose, Bld: 98 mg/dL (ref 70–99)
Potassium: 3.6 mmol/L (ref 3.5–5.1)
Sodium: 138 mmol/L (ref 135–145)
Total Bilirubin: 0.9 mg/dL (ref 0.3–1.2)
Total Protein: 7.4 g/dL (ref 6.5–8.1)

## 2022-03-12 LAB — URINALYSIS, ROUTINE W REFLEX MICROSCOPIC
Bacteria, UA: NONE SEEN
Bilirubin Urine: NEGATIVE
Glucose, UA: NEGATIVE mg/dL
Hgb urine dipstick: NEGATIVE
Ketones, ur: NEGATIVE mg/dL
Leukocytes,Ua: NEGATIVE
Nitrite: NEGATIVE
Protein, ur: NEGATIVE mg/dL
Specific Gravity, Urine: 1.026 (ref 1.005–1.030)
pH: 7 (ref 5.0–8.0)

## 2022-03-12 LAB — CBC WITH DIFFERENTIAL/PLATELET
Abs Immature Granulocytes: 0.02 10*3/uL (ref 0.00–0.07)
Basophils Absolute: 0 10*3/uL (ref 0.0–0.1)
Basophils Relative: 0 %
Eosinophils Absolute: 0 10*3/uL (ref 0.0–0.5)
Eosinophils Relative: 0 %
HCT: 42.9 % (ref 36.0–46.0)
Hemoglobin: 14.1 g/dL (ref 12.0–15.0)
Immature Granulocytes: 0 %
Lymphocytes Relative: 6 %
Lymphs Abs: 0.6 10*3/uL — ABNORMAL LOW (ref 0.7–4.0)
MCH: 29.2 pg (ref 26.0–34.0)
MCHC: 32.9 g/dL (ref 30.0–36.0)
MCV: 88.8 fL (ref 80.0–100.0)
Monocytes Absolute: 0.3 10*3/uL (ref 0.1–1.0)
Monocytes Relative: 3 %
Neutro Abs: 9.6 10*3/uL — ABNORMAL HIGH (ref 1.7–7.7)
Neutrophils Relative %: 91 %
Platelets: 243 10*3/uL (ref 150–400)
RBC: 4.83 MIL/uL (ref 3.87–5.11)
RDW: 13.2 % (ref 11.5–15.5)
WBC: 10.5 10*3/uL (ref 4.0–10.5)
nRBC: 0 % (ref 0.0–0.2)

## 2022-03-12 LAB — PREGNANCY, URINE: Preg Test, Ur: NEGATIVE

## 2022-03-12 LAB — LIPASE, BLOOD: Lipase: 34 U/L (ref 11–51)

## 2022-03-12 MED ORDER — IBUPROFEN 200 MG PO TABS: 600.0000 mg | ORAL_TABLET | Freq: Once | ORAL | Status: DC

## 2022-03-12 NOTE — ED Provider Notes (Signed)
[facility] DEPT Provider Note   CSN: [id number] Arrival date & time: 03/12/22  1528     History  Chief Complaint  Patient presents with   Abdominal Pain    Sherry [name] is a 41 y.o. female with anemia, depression, AUB, who presents with pelvic/abdominal pain.    Pt complains of severe abdominal pain that started yesterday. Located very low in her pelvis and feels like previous episodes of uterine cramping. Has felt this before but not so severe. 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  She noted increasing right lower pelvic pain as she was rushing to finish the room, felt feelings of dizziness as if she was going to pass out.  She subsequently went to the trainers closet to lay down and did not syncopize or hit her head. She states that since then, standing up exacerbates her symptoms.  Reports history of right lower pelvic pain for the past 2 to 3 months the pain was significantly worse earlier today during episode.  Denies fever, chills, chest pain, shortness of breath, urinary symptoms, vaginal symptoms including bleeding, change in bowel habits. Last period was approximately 3 weeks ago. Patient is [id number]. Denies recent unprotected sex or vaginal discharge.   Abdominal Pain      Home Medications Prior to Admission medications   Medication Sig Start Date End Date Taking? Authorizing Provider  Acetaminophen-Caff-Pyrilamine 017-51-02 MG TABS Take 2 tablets by mouth every 8 (eight) hours as needed (cramping). No more than 6 tablets per day 04/14/19   Wieters, Hallie C, PA-C  citalopram (CELEXA) 40 MG tablet Take 1 tablet (40 mg total) by mouth daily. 01/23/22   Billie Ruddy, MD  hydrochlorothiazide (HYDRODIURIL) 12.5 MG tablet Take 1 tablet (12.5 mg total) by mouth daily. 01/23/22   Billie Ruddy, MD  Multiple Vitamins-Minerals (ALIVE WOMENS GUMMY) CHEW Chew 1 each by mouth daily.     [provider]      Allergies    Patient has no known allergies.    Review of Systems   Review of Systems  Gastrointestinal:  Positive for abdominal pain.   Review of systems Negative for f/c, vaginal bleeding.  A 10 point review of systems was performed and is negative unless otherwise reported in HPI.  Physical Exam Updated Vital Signs BP 127/85   Pulse 82   Temp 98.2 F (36.8 C)   Resp 16   Ht '5\' 6"'$  (1.676 m)   Wt 93 kg   LMP 02/11/2022   SpO2 99%   BMI 33.09 kg/m  Physical Exam General: Normal appearing female, lying in bed.  HEENT: PERRLA, Sclera anicteric, MMM, trachea midline.  Cardiology: RRR, no murmurs/rubs/gallops. BL radial and DP pulses equal bilaterally.  Resp: Normal respiratory rate and effort. CTAB, no wheezes, rhonchi, crackles.  Abd: Tender in suprapubic and BL adnexal areas. Soft, non-distended. No rebound tenderness or guarding.  GU: Deferred. MSK: No peripheral edema or signs of trauma. Extremities without deformity or TTP. No cyanosis or clubbing. Skin: warm, dry. No rashes or lesions. Back: No CVA tenderness Neuro: A&Ox4, CNs II-XII grossly intact. MAEs. Sensation grossly intact.  Psych: Normal mood and affect.   ED Results / Procedures / Treatments   Labs (all labs ordered are listed, but only abnormal results are displayed) Labs Reviewed  CBC WITH DIFFERENTIAL/PLATELET - Abnormal; Notable for the following components:      Result Value   Neutro  Abs 9.6 (*)    Lymphs Abs 0.6 (*)    All other components within normal limits  COMPREHENSIVE METABOLIC PANEL  URINALYSIS, ROUTINE W REFLEX MICROSCOPIC  PREGNANCY, URINE  LIPASE, BLOOD    EKG EKG Interpretation  Date/Time:  Wednesday March 12 2022 15:40:59 EST Ventricular Rate:  82 PR Interval:  167 QRS Duration: 90 QT Interval:  381 QTC Calculation: 445 R Axis:   88 Text Interpretation: Sinus rhythm Baseline wander Otherwise within normal limits Confirmed by Carmin Muskrat  [phone]) on 03/13/2022 10:45:19 PM  Radiology No results found.  Procedures Procedures    Medications Ordered in ED Medications - No data to display   ED Course/ Medical Decision Making/ A&P                          Medical Decision Making Amount and/or Complexity of Data Reviewed Radiology:  Decision-making details documented in ED Course.    This patient presents to the ED for concern of abdominal pain, this involves an extensive number of treatment options, and is a complaint that carries with it a high risk of complications and morbidity.  I considered the following differential and admission for this acute condition.  However, patient is HDS and well-appearing, afebrile at this time.  MDM:    For DDX for abdominal pain includes but is not limited to:  Abdominal exam without peritoneal signs. No evidence of acute abdomen at this time.  Patient's pain low in pelvis, consider pregnancy, ectopic pregnancy, threatened/missed abortion though no VB, ovarian torsion, PID/TOA though no vaginal symptoms. Considered acute appendicitis but given location of her pain/quality of cramping and history of AUB, pelvic US was ordered first from triage. Also consider UTI/pyelo though no CVA tenderness. With her episode of lightheadedness will r/o ruptured ectopic. She is HDS and not hypotensive, no lightheadedness at this time. Possible she had a vagal response but will eval for anemia, arrhythmia, hypo/hyperglycemia, electrolyte abnormalities.  Low suspicion for acute hepatobiliary disease (including acute cholecystitis or cholangitis w/ no RUQ pain or TTP), acute pancreatitis (neg lipase, no epigastric tenderness), PUD (including gastric perforation). Low c/f also for vascular catastrophe, bowel obstruction (no constipation, N/V. Abd distension), viscus perforation, or diverticulitis.    Clinical Course as of 03/21/22 1155  Wed Mar 12, 2022  2026 CMP, lipase, CBC unremarkable [HN]  2026 DG  Chest 1 View No active disease. [HN]  2026 US Pelvis Complete IMPRESSION: Heterogeneity of the myometrial echotexture raises question of adenomyosis.  Probable fibroid uterus.  No evidence of torsion.   [HN]  2252 Labs including CBC, CMP, upreg, lipase, and UA unremarkable [HN]  2253 Patient states her pain has slightly improved. She is HDS and well-appearing. Patient will be discharged with instructions to f/u with her gynecologist in the next week. She will call them in the AM to make an appointment. Patient s given DC instructions/return precautions. All questions answered to patient's satisfaction.  [HN]    Clinical Course User Index [HN] Audley Hose, MD    Labs: I Ordered, and personally interpreted labs.  The pertinent results include those listed above  Imaging Studies ordered: I ordered imaging studies including pelvic US, complete US I independently visualized and interpreted imaging. I agree with the radiologist interpretation  Additional history obtained from patient's mother at bedside and chart review.   Cardiac Monitoring: The patient was maintained on a cardiac monitor.  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  Patient reports understanding.  Co morbidities that complicate the patient evaluation  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   Severe anemia 06/06/2015   Shortness of breath dyspnea    with exertion      Medicines Meds ordered this encounter  Medications   DISCONTD: ibuprofen (ADVIL) tablet 600 mg    I have reviewed the patients home medicines and have made adjustments as needed  Problem List / ED Course: Problem List Items Addressed This Visit   None Visit Diagnoses     Adenomyosis    -  Primary   Uterine leiomyoma, unspecified location            This note was created using dictation software, which may contain spelling or grammatical errors.    Audley Hose, MD 03/21/22 1155

## 2022-03-12 NOTE — ED Triage Notes (Signed)
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 Patient c/o intermittent abdominal cramping for past 3 months. Patient reports abdominal cramping but denies any vaginal bleeding.

## 2022-03-12 NOTE — ED Provider Triage Note (Signed)
Emergency Medicine Provider Triage Evaluation Note  Sherry [name] , a 41 y.o. female  was evaluated in triage.  Pt complains of near syncope.  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  She noted increasing right lower pelvic pain as she was rushing to finish the room, felt feelings of dizziness as if she was going to pass out.  She subsequently went to the trainers closet to lay down of which helped her symptoms to.  She states that since then, standing up exacerbates her symptoms.  Reports history of right lower pelvic pain for the past 2 to 3 months the pain was significantly worse earlier today during episode.  She denies weakness/sensory deficits, slurred speech, true facial droop, gait abnormalities.  Denies fever, chills, chest pain, shortness of breath, urinary symptoms, vaginal symptoms, change in bowel habits.  Review of Systems  Positive:  Negative: See above  Physical Exam  BP 137/88   Pulse 82   Temp 98.4 F (36.9 C) (Oral)   Resp 18   Ht '5\' 6"'$  (1.676 m)   Wt 93 kg   LMP 02/11/2022   SpO2 99%   BMI 33.09 kg/m  Gen:   Awake, no distress   Resp:  Normal effort  MSK:   Moves extremities without difficulty  Other:  Right-sided pelvic pain.  Medical Decision Making  Medically screening exam initiated at 3:56 PM.  Appropriate orders placed.  Yazmin Locher was informed that the remainder of the evaluation will be completed by another provider, this initial triage assessment does not replace that evaluation, and the importance of remaining in the ED until their evaluation is complete.     Wilnette Kales, [place] 03/12/22 1557

## 2022-03-12 NOTE — Discharge Instructions (Addendum)
Thank you for coming to [facility]. You were seen for abdominal cramping. We did an exam, labs, and imaging, and these showed fibroids in the uterus as well as something called adenomyosis, which is abnormal growth of a part of the uterus.  Please call your gynecologist in the morning make an appointment for this week..   Do not hesitate to return to the ED or call 911 if you experience: -Worsening symptoms -Vaginal bleeding saturating greater than 1 pad per hour for 3 hours -Chest pain, shortness of breath -Lightheadedness, passing out -Fevers/chills -Anything else that concerns you

## 2022-03-12 NOTE — ED Notes (Signed)
Delay in lab draw. Patient in ultrasound.

## 2022-03-20 ENCOUNTER — Ambulatory Visit: Payer: 59 | Admitting: Family Medicine

## 2022-03-20 ENCOUNTER — Encounter: Payer: Self-pay | Admitting: Family Medicine

## 2022-03-20 ENCOUNTER — Other Ambulatory Visit (HOSPITAL_COMMUNITY)
Admission: RE | Admit: 2022-03-20 | Discharge: 2022-03-20 | Disposition: A | Discharge status: Home / Self Care | Payer: 59 | Source: Ambulatory Visit | Attending: Family Medicine | Admitting: Family Medicine

## 2022-03-20 VITALS — BP 118/74 | HR 70 | Temp 98.2°F | Wt 199.2 lb

## 2022-03-20 DIAGNOSIS — Z113 Encounter for screening for infections with a predominantly sexual mode of transmission: Secondary | ICD-10-CM | POA: Insufficient documentation

## 2022-03-20 DIAGNOSIS — N8003 Adenomyosis of the uterus: Secondary | ICD-10-CM | POA: Diagnosis not present

## 2022-03-20 DIAGNOSIS — Z124 Encounter for screening for malignant neoplasm of cervix: Secondary | ICD-10-CM

## 2022-03-20 DIAGNOSIS — D219 Benign neoplasm of connective and other soft tissue, unspecified: Secondary | ICD-10-CM

## 2022-03-20 NOTE — Progress Notes (Signed)
Subjective:    Patient ID: Sherry [name], female    DOB: July 27, 1980, 41 y.o.   MRN: [id number]  Chief Complaint  Patient presents with   OTHER    Pt is here for papsmear. She reports she went ED last for severe lower abdominal pain. Did [place].     HPI Patient was seen today for Pap.  Patient states she was last seen in ED 03/12/22 for severe right-sided lower abdominal pain a few days before start of menses.  Unable to review ED note as unavailable..  Transvaginal ultrasound and pelvic ultrasound done.  Patient states she was told she has adenomyosis.  Patient currently without pain.  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   Severe anemia 06/06/2015   Shortness of breath dyspnea    with exertion    No Known Allergies  ROS General: Denies fever, chills, night sweats, changes in weight, changes in appetite HEENT: Denies headaches, ear pain, changes in vision, rhinorrhea, sore throat CV: Denies CP, palpitations, SOB, orthopnea Pulm: Denies SOB, cough, wheezing GI: Denies abdominal pain, nausea, vomiting, diarrhea, constipation GU: Denies dysuria, hematuria, frequency, vaginal discharge Msk: Denies muscle cramps, joint pains Neuro: Denies weakness, numbness, tingling Skin: Denies rashes, bruising Psych: Denies depression, anxiety, hallucinations     Objective:    Blood pressure 118/74, pulse 70, temperature 98.2 F (36.8 C), temperature source Oral, weight 199 lb 3.2 oz (90.4 kg), last menstrual period 02/11/2022, SpO2 97 %.   Gen. Pleasant, well-nourished, in no distress, normal affect   HEENT: [place]/AT, face symmetric, conjunctiva clear, no scleral icterus, PERRLA, EOMI, nares patent without drainage Lungs: no accessory muscle use Cardiovascular: RRR, no peripheral edema GU: Normal external female genitalia. no lesions  noted.  Normal urethral meatus, perineum, anus, introitus.  Normal vaginal rugae.  Cervix pointed toward patient's right.  Scant whitish discharge at os.  Pap obtained.  No CMT. Abdomen: BS present, soft, NT/ND, no hepatosplenomegaly. Neuro:  A&Ox3, CN II-XII intact, normal gait Skin:  Warm, no lesions/ rash   Wt Readings from Last 3 Encounters:  03/20/22 199 lb 3.2 oz (90.4 kg)  03/12/22 205 lb (93 kg)  01/23/22 202 lb 3.2 oz (91.7 kg)    Lab Results  Component Value Date   WBC 10.5 03/12/2022   HGB 14.1 03/12/2022   HCT 42.9 03/12/2022   PLT 243 03/12/2022   GLUCOSE 98 03/12/2022   CHOL 135 04/03/2021   TRIG 100.0 04/03/2021   HDL 38.30 (L) 04/03/2021   LDLCALC 76 04/03/2021   ALT 12 03/12/2022   AST 17 03/12/2022   NA 138 03/12/2022   K 3.6 03/12/2022   CL 104 03/12/2022   CREATININE 0.90 03/12/2022   BUN 13 03/12/2022   CO2 27 03/12/2022   TSH 1.93 03/21/2020   INR 1.22 06/06/2015   HGBA1C 5.5 04/03/2021    Assessment/Plan:  Adenomyosis  Cervical cancer screening - Plan: PAP [[place]]  Routine screening for STI (sexually transmitted infection) - Plan: PAP [[place]]  Fibroid  Referral to OB/GYN placed for adenomyosis and uterine leiomyoma as noted on recent imaging from ED visit 03/12/2022.  Likely contributing to patient's right-sided abdominal pain.  Pap obtained this visit with STI testing per patient request.  30 minutes spent reviewing chart, obtaining history, examining patient, and formulating care plan.  F/u as needed  Grier Mitts, MD

## 2022-03-25 LAB — CYTOLOGY - PAP
Adequacy: ABSENT
Chlamydia: NEGATIVE
Comment: NEGATIVE
Comment: NEGATIVE
Comment: NEGATIVE
Comment: NEGATIVE
Comment: NORMAL
Diagnosis: NEGATIVE
HSV1: NEGATIVE
HSV2: NEGATIVE
High risk HPV: NEGATIVE
Neisseria Gonorrhea: NEGATIVE
Trichomonas: NEGATIVE

## 2022-03-26 ENCOUNTER — Ambulatory Visit: Payer: BC Managed Care – PPO | Admitting: Family Medicine

## 2022-03-27 ENCOUNTER — Ambulatory Visit: Payer: BC Managed Care – PPO | Admitting: Family Medicine

## 2022-05-16 ENCOUNTER — Other Ambulatory Visit (HOSPITAL_COMMUNITY): Payer: Self-pay

## 2022-05-21 ENCOUNTER — Other Ambulatory Visit: Payer: Self-pay

## 2022-05-26 ENCOUNTER — Encounter: Payer: Self-pay | Admitting: Obstetrics and Gynecology

## 2022-05-26 ENCOUNTER — Other Ambulatory Visit (HOSPITAL_COMMUNITY): Payer: Self-pay

## 2022-05-26 ENCOUNTER — Ambulatory Visit: Payer: Commercial Managed Care - PPO | Admitting: Obstetrics and Gynecology

## 2022-05-26 VITALS — BP 121/83 | HR 73 | Ht 66.0 in | Wt 204.0 lb

## 2022-05-26 DIAGNOSIS — R1031 Right lower quadrant pain: Secondary | ICD-10-CM | POA: Diagnosis not present

## 2022-05-26 DIAGNOSIS — Z1231 Encounter for screening mammogram for malignant neoplasm of breast: Secondary | ICD-10-CM

## 2022-05-26 DIAGNOSIS — G8929 Other chronic pain: Secondary | ICD-10-CM | POA: Diagnosis not present

## 2022-05-26 MED ORDER — NORETHINDRONE 0.35 MG PO TABS
1.0000 | ORAL_TABLET | Freq: Every day | ORAL | 11 refills | Status: AC
  Filled 2022-05-26: qty 28, 28d supply, fill #0
  Filled 2022-06-25: qty 28, 28d supply, fill #1
  Filled 2022-07-23: qty 28, 28d supply, fill #2
  Filled 2022-08-29: qty 28, 28d supply, fill #3
  Filled 2022-10-02: qty 28, 28d supply, fill #4
  Filled 2022-11-03: qty 28, 28d supply, fill #5
  Filled 2022-12-08: qty 28, 28d supply, fill #6
  Filled 2023-01-22: qty 28, 28d supply, fill #7
  Filled 2023-02-22: qty 28, 28d supply, fill #8
  Filled 2023-03-22: qty 28, 28d supply, fill #9
  Filled 2023-05-13: qty 28, 28d supply, fill #10

## 2022-05-26 NOTE — Progress Notes (Signed)
42 y.o New GYN referral presents for management of Fibroids.  C/o irregular periods, abdominal pains 10/10 x6 yrs.

## 2022-05-26 NOTE — Progress Notes (Signed)
42 yo P3 referred to discuss November 2023 ultrasound findings and intermittent RLQ pain. Patient reports irregular menses since endometrial ablation. She describes a period every other months lasting 3-4 days and very light in flow. She reports severe RLQ pain several days before her period. The pain radiates to her thigh, typically last a few days and self resolves. She is sexually active using BTL for contraception. She denies any dyspareunia. She denies abnormal discharge. Patient was seen in ED in November and was told she has adenomyosis and a fibroid. Patient is not interested in any surgical interventions at this time  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   Severe anemia 06/06/2015   Shortness of breath dyspnea    with exertion   Past Surgical History:  Procedure Laterality Date   BARTHOLIN CYST MARSUPIALIZATION     CESAREAN SECTION     x2   CESAREAN SECTION WITH BILATERAL TUBAL LIGATION N/A 04/03/2014   Procedure: CESAREAN SECTION WITH BILATERAL TUBAL LIGATION;  Surgeon: Jerelyn Charles, MD;  Location: [facility];  Service: Obstetrics;  Laterality: N/A;   DENTAL SURGERY  2010   6 teeth surgically removed   HYSTEROSCOPY WITH NOVASURE N/A 06/27/2015   Procedure: HYSTEROSCOPY WITH NOVASURE;  Surgeon: Lavonia Drafts, MD;  Location: [facility];  Service: Gynecology;  Laterality: N/A;   Family History  Problem Relation Age of Onset   Diabetes Mother        Type 2   Hypertension Mother    Depression Father    Diabetes Maternal Aunt    Hypertension Maternal Aunt    Hypertension Maternal Aunt    Social History   Tobacco Use   Smoking status: Never   Smokeless tobacco: Never  Substance Use Topics   Alcohol use: No   Drug use: No   ROS See pertinent in HPI. All other systems reviewed and non contributory Blood pressure  121/83, pulse 73, height 5' 6"$  (1.676 m), weight 204 lb (92.5 kg), last menstrual period 04/02/2022.  GENERAL: Well-developed, well-nourished female in no acute distress.  ABDOMEN: Soft, nontender, nondistended. No organomegaly. PELVIC: Normal external female genitalia. Vagina is pink and rugated.  Normal discharge. Normal appearing cervix. Uterus is normal in size. No adnexal mass or tenderness. Chaperone present during the pelvic exam EXTREMITIES: No cyanosis, clubbing, or edema, 2+ distal pulses.  US Pelvis Complete  Result Date: 03/12/2022 CLINICAL DATA:  Right lower quadrant pain EXAM: TRANSABDOMINAL AND TRANSVAGINAL ULTRASOUND OF PELVIS DOPPLER ULTRASOUND OF OVARIES TECHNIQUE: Both transabdominal and transvaginal ultrasound examinations of the pelvis were performed. Transabdominal technique was performed for global imaging of the pelvis including uterus, ovaries, adnexal regions, and pelvic cul-de-sac. It was necessary to proceed with endovaginal exam following the transabdominal exam to visualize the uterus, endometrium, and adnexa. Color and duplex Doppler ultrasound was utilized to evaluate blood flow to the ovaries. COMPARISON:  Pelvic ultrasound 06/06/2015 FINDINGS: Suboptimal images due to overlying bowel gas. Uterus Measurements: 7.8 x 4.6 x 4.3 cm = volume: 82 mL. Myometrial echotexture is slightly heterogenous. A hypoechoic mass with a septation and measures 1.6 x 1.6 x 1.6 cm. An additional heterogenous area seen in the right uterine fundus could represent a fibroid versus artifact. Endometrium Thickness: 8.0 mm.  No focal abnormality visualized. Right ovary Measurements: 2.6 x 1.0 x 1.9 cm = volume: 2.5  mL. Normal appearance/no adnexal mass. Left ovary Measurements: 2.7 x 1.5 x 1.7 cm = volume: 3.7 mL. Normal appearance/no adnexal mass. Pulsed Doppler evaluation of both ovaries demonstrates normal low-resistance arterial and venous waveforms. Other findings Trace free fluid in the  cul-de-sac. IMPRESSION: Heterogeneity of the myometrial echotexture raises question of adenomyosis. Probable fibroid uterus. No evidence of torsion. Electronically Signed   By: Placido Sou M.D.   On: 03/12/2022 18:12    A/P 42 yo here to discuss ultrasound - Reviewed ultrasound findings with the patient - Offered medical management with a trial of POP. Patient agrees as she is not interested in surgical interventions at this time - Patient with normal pap smear 03/2022 - Referral for screening mammogram ordered - RTC in 6 months if no improvement in her pain

## 2022-06-30 ENCOUNTER — Other Ambulatory Visit (HOSPITAL_COMMUNITY): Payer: Self-pay

## 2022-07-22 ENCOUNTER — Encounter: Payer: Self-pay | Admitting: Obstetrics and Gynecology

## 2022-07-23 ENCOUNTER — Other Ambulatory Visit (HOSPITAL_COMMUNITY): Payer: Self-pay

## 2022-07-23 ENCOUNTER — Encounter: Payer: Self-pay | Admitting: Obstetrics and Gynecology

## 2022-07-24 ENCOUNTER — Other Ambulatory Visit: Payer: Self-pay | Admitting: Obstetrics and Gynecology

## 2022-07-24 ENCOUNTER — Telehealth: Payer: Self-pay | Admitting: *Deleted

## 2022-07-24 DIAGNOSIS — N644 Mastodynia: Secondary | ICD-10-CM

## 2022-07-24 NOTE — Telephone Encounter (Signed)
TC to [facility] to discuss need for screening mammogram not diagnostic mammogram for pt. Per staff Clinical Tech Crissie states pt will need diagnostic mammogram. Staff msg sent to Dr. Jolayne Panther for further instructions/ orders.

## 2022-08-06 ENCOUNTER — Other Ambulatory Visit: Payer: Self-pay | Admitting: Obstetrics and Gynecology

## 2022-08-06 DIAGNOSIS — N644 Mastodynia: Secondary | ICD-10-CM

## 2022-08-21 ENCOUNTER — Ambulatory Visit: Payer: Commercial Managed Care - PPO

## 2022-08-21 ENCOUNTER — Ambulatory Visit: Admission: RE | Admit: 2022-08-21 | Payer: Commercial Managed Care - PPO | Source: Ambulatory Visit

## 2022-08-21 ENCOUNTER — Ambulatory Visit
Admission: RE | Admit: 2022-08-21 | Discharge: 2022-08-21 | Disposition: A | Discharge status: Home / Self Care | Payer: Commercial Managed Care - PPO | Source: Ambulatory Visit | Attending: Obstetrics and Gynecology | Admitting: Obstetrics and Gynecology

## 2022-08-21 DIAGNOSIS — N644 Mastodynia: Secondary | ICD-10-CM | POA: Diagnosis not present

## 2022-08-29 ENCOUNTER — Other Ambulatory Visit (HOSPITAL_COMMUNITY): Payer: Self-pay

## 2022-08-29 ENCOUNTER — Other Ambulatory Visit: Payer: Self-pay

## 2022-08-29 ENCOUNTER — Other Ambulatory Visit: Payer: Self-pay | Admitting: Family Medicine

## 2022-08-29 DIAGNOSIS — F411 Generalized anxiety disorder: Secondary | ICD-10-CM

## 2022-08-29 MED ORDER — CITALOPRAM HYDROBROMIDE 40 MG PO TABS
40.0000 mg | ORAL_TABLET | Freq: Every day | ORAL | 0 refills | Status: DC
  Filled 2022-08-29: qty 90, 90d supply, fill #0

## 2022-12-01 ENCOUNTER — Other Ambulatory Visit: Payer: Self-pay | Admitting: Family Medicine

## 2022-12-01 ENCOUNTER — Other Ambulatory Visit (HOSPITAL_COMMUNITY): Payer: Self-pay

## 2022-12-01 DIAGNOSIS — F411 Generalized anxiety disorder: Secondary | ICD-10-CM

## 2022-12-01 MED ORDER — CITALOPRAM HYDROBROMIDE 40 MG PO TABS
40.0000 mg | ORAL_TABLET | Freq: Every day | ORAL | 0 refills | Status: DC
  Filled 2022-12-08: qty 90, 90d supply, fill #0

## 2022-12-03 ENCOUNTER — Other Ambulatory Visit: Payer: Self-pay

## 2022-12-08 ENCOUNTER — Other Ambulatory Visit (HOSPITAL_COMMUNITY): Payer: Self-pay

## 2023-02-18 ENCOUNTER — Other Ambulatory Visit: Payer: Self-pay | Admitting: Family Medicine

## 2023-02-18 DIAGNOSIS — I10 Essential (primary) hypertension: Secondary | ICD-10-CM

## 2023-02-18 DIAGNOSIS — F411 Generalized anxiety disorder: Secondary | ICD-10-CM

## 2023-02-20 ENCOUNTER — Other Ambulatory Visit (HOSPITAL_COMMUNITY): Payer: Self-pay

## 2023-02-20 MED ORDER — HYDROCHLOROTHIAZIDE 12.5 MG PO TABS
12.5000 mg | ORAL_TABLET | Freq: Every day | ORAL | 0 refills | Status: DC
  Filled 2023-02-20: qty 30, 30d supply, fill #0

## 2023-02-20 MED ORDER — CITALOPRAM HYDROBROMIDE 40 MG PO TABS
40.0000 mg | ORAL_TABLET | Freq: Every day | ORAL | 0 refills | Status: DC
  Filled 2023-02-20: qty 30, 30d supply, fill #0

## 2023-03-16 ENCOUNTER — Encounter: Payer: Self-pay | Admitting: Family Medicine

## 2023-03-16 ENCOUNTER — Other Ambulatory Visit (HOSPITAL_COMMUNITY): Payer: Self-pay

## 2023-03-16 ENCOUNTER — Ambulatory Visit (INDEPENDENT_AMBULATORY_CARE_PROVIDER_SITE_OTHER): Payer: Commercial Managed Care - PPO | Admitting: Family Medicine

## 2023-03-16 VITALS — BP 138/90 | HR 80 | Temp 98.1°F | Ht 66.0 in | Wt 221.0 lb

## 2023-03-16 DIAGNOSIS — F411 Generalized anxiety disorder: Secondary | ICD-10-CM | POA: Diagnosis not present

## 2023-03-16 DIAGNOSIS — R635 Abnormal weight gain: Secondary | ICD-10-CM | POA: Diagnosis not present

## 2023-03-16 DIAGNOSIS — Z0000 Encounter for general adult medical examination without abnormal findings: Secondary | ICD-10-CM | POA: Diagnosis not present

## 2023-03-16 DIAGNOSIS — E782 Mixed hyperlipidemia: Secondary | ICD-10-CM

## 2023-03-16 DIAGNOSIS — I10 Essential (primary) hypertension: Secondary | ICD-10-CM

## 2023-03-16 LAB — COMPREHENSIVE METABOLIC PANEL
ALT: 13 U/L (ref 0–35)
AST: 17 U/L (ref 0–37)
Albumin: 4.3 g/dL (ref 3.5–5.2)
Alkaline Phosphatase: 81 U/L (ref 39–117)
BUN: 7 mg/dL (ref 6–23)
CO2: 31 meq/L (ref 19–32)
Calcium: 9 mg/dL (ref 8.4–10.5)
Chloride: 102 meq/L (ref 96–112)
Creatinine, Ser: 0.87 mg/dL (ref 0.40–1.20)
GFR: 83.16 mL/min (ref >60.00)
Glucose, Bld: 87 mg/dL (ref 70–99)
Potassium: 3.7 meq/L (ref 3.5–5.1)
Sodium: 138 meq/L (ref 135–145)
Total Bilirubin: 0.4 mg/dL (ref 0.2–1.2)
Total Protein: 7 g/dL (ref 6.0–8.3)

## 2023-03-16 LAB — LIPID PANEL
Cholesterol: 171 mg/dL (ref 0–200)
HDL: 32.2 mg/dL — ABNORMAL LOW (ref >39.00)
LDL Cholesterol: 111 mg/dL — ABNORMAL HIGH (ref 0–99)
NonHDL: 138.31
Total CHOL/HDL Ratio: 5
Triglycerides: 135 mg/dL (ref 0.0–149.0)
VLDL: 27 mg/dL (ref 0.0–40.0)

## 2023-03-16 LAB — CBC WITH DIFFERENTIAL/PLATELET
Basophils Absolute: 0 10*3/uL (ref 0.0–0.1)
Basophils Relative: 0.3 % (ref 0.0–3.0)
Eosinophils Absolute: 0.2 10*3/uL (ref 0.0–0.7)
Eosinophils Relative: 2 % (ref 0.0–5.0)
HCT: 42.1 % (ref 36.0–46.0)
Hemoglobin: 13.9 g/dL (ref 12.0–15.0)
Lymphocytes Relative: 30.1 % (ref 12.0–46.0)
Lymphs Abs: 2.3 10*3/uL (ref 0.7–4.0)
MCHC: 32.9 g/dL (ref 30.0–36.0)
MCV: 88.4 fL (ref 78.0–100.0)
Monocytes Absolute: 0.3 10*3/uL (ref 0.1–1.0)
Monocytes Relative: 4.5 % (ref 3.0–12.0)
Neutro Abs: 4.8 10*3/uL (ref 1.4–7.7)
Neutrophils Relative %: 63.1 % (ref 43.0–77.0)
Platelets: 339 10*3/uL (ref 150.0–400.0)
RBC: 4.77 Mil/uL (ref 3.87–5.11)
RDW: 13.1 % (ref 11.5–15.5)
WBC: 7.6 10*3/uL (ref 4.0–10.5)

## 2023-03-16 LAB — T4, FREE: Free T4: 0.85 ng/dL (ref 0.60–1.60)

## 2023-03-16 LAB — HEMOGLOBIN A1C: Hgb A1c MFr Bld: 5.9 % (ref 4.6–6.5)

## 2023-03-16 LAB — VITAMIN D 25 HYDROXY (VIT D DEFICIENCY, FRACTURES): VITD: 20.28 ng/mL — ABNORMAL LOW (ref 30.00–100.00)

## 2023-03-16 LAB — TSH: TSH: 1.76 u[IU]/mL (ref 0.35–5.50)

## 2023-03-16 MED ORDER — HYDROCHLOROTHIAZIDE 12.5 MG PO TABS
12.5000 mg | ORAL_TABLET | Freq: Every day | ORAL | 0 refills | Status: DC
  Filled 2023-03-16 – 2023-03-27 (×2): qty 30, 30d supply, fill #0

## 2023-03-16 MED ORDER — CITALOPRAM HYDROBROMIDE 40 MG PO TABS
40.0000 mg | ORAL_TABLET | Freq: Every day | ORAL | 1 refills | Status: DC
  Filled 2023-03-16 – 2023-05-13 (×2): qty 90, 90d supply, fill #0
  Filled 2023-09-21: qty 90, 90d supply, fill #1

## 2023-03-16 NOTE — Progress Notes (Signed)
Established Patient Office Visit   Subjective  Patient ID: Sherry [name], female    DOB: 24-Jun-1980  Age: 42 y.o. MRN: [id number]  Chief Complaint  Patient presents with   Annual Exam    Patient is a 42 year old female seen for CPE.  Patient mentions unexplained weight gain over the last few months.  Eating once a day.  Taking HCTZ 12.5 mg daily for BP.  Requesting refill.  Does not have BP cuff at home.  Taking Celexa 40 mg daily.  States med makes her a little calmer but still having issues with anxiety.  At times sleep is difficult due to anxiety.  Endorses anxiety regarding financial situation.    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   Depression, recurrent (HCC) 01/22/2017   Abnormal uterine bleeding (AUB) 06/27/2015   Severe anemia 06/06/2015   Symptomatic anemia 06/06/2015   History of cesarean section 04/03/2014   ASCUS with positive high risk HPV 10/19/2012   Yeast dermatitis 10/19/2012   Past Medical History:  Diagnosis Date   Abnormal uterine bleeding (AUB) 06/27/2015   Anxiety    Depression    GERD (gastroesophageal reflux disease)    with pregnancy   H/O chlamydia infection    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   Severe anemia 06/06/2015   Shortness of breath dyspnea    with exertion   Past Surgical History:  Procedure Laterality Date   BARTHOLIN CYST MARSUPIALIZATION     CESAREAN SECTION     x2   CESAREAN SECTION WITH BILATERAL TUBAL LIGATION N/A 04/03/2014   Procedure: CESAREAN SECTION WITH BILATERAL TUBAL LIGATION;  Surgeon: Marlow Baars, MD;  Location: [facility];  Service: Obstetrics;  Laterality: N/A;   DENTAL SURGERY  2010   6 teeth surgically removed   HYSTEROSCOPY WITH NOVASURE N/A 06/27/2015   Procedure: HYSTEROSCOPY WITH NOVASURE;  Surgeon: Willodean Rosenthal, MD;  Location: [facility];  Service: Gynecology;   Laterality: N/A;   Social History   Tobacco Use   Smoking status: Never   Smokeless tobacco: Never  Substance Use Topics   Alcohol use: Yes    Comment: rarely   Drug use: No   Family History  Problem Relation Age of Onset   Diabetes Mother        Type 2   Hypertension Mother    Depression Father    Diabetes Maternal Aunt    Hypertension Maternal Aunt    Hypertension Maternal Aunt    No Known Allergies    ROS Negative unless stated above    Objective:     BP (!) 138/90 (BP Location: Left Arm, Patient Position: Sitting, Cuff Size: Large)   Pulse 80   Temp 98.1 F (36.7 C) (Oral)   Ht 5\' 6"  (1.676 m)   Wt 221 lb (100.2 kg)   LMP  (LMP Unknown)   SpO2 98%   BMI 35.67 kg/m  BP Readings from Last 3 Encounters:  05/26/22 121/83  03/20/22 118/74  02/09/22 128/84   Wt Readings from Last 3 Encounters:  05/26/22 204 lb (92.5 kg)  03/20/22 199 lb 3.2 oz (90.4 kg)  01/23/22 202 lb 3.2 oz (91.7 kg)      Physical Exam Constitutional:      Appearance: Normal appearance.  HENT:     Head: Normocephalic and atraumatic.     Right Ear: Tympanic membrane, ear canal and external ear  normal.     Left Ear: Tympanic membrane, ear canal and external ear normal.     Nose: Nose normal.     Mouth/Throat:     Mouth: Mucous membranes are moist.     Pharynx: No oropharyngeal exudate or posterior oropharyngeal erythema.  Eyes:     General: No scleral icterus.    Extraocular Movements: Extraocular movements intact.     Conjunctiva/sclera: Conjunctivae normal.     Pupils: Pupils are equal, round, and reactive to light.  Neck:     Thyroid: No thyromegaly.  Cardiovascular:     Rate and Rhythm: Normal rate and regular rhythm.     Pulses: Normal pulses.     Heart sounds: Normal heart sounds. No murmur heard.    No friction rub.  Pulmonary:     Effort: Pulmonary effort is normal.     Breath sounds: Normal breath sounds. No wheezing, rhonchi or rales.  Abdominal:     General:  Bowel sounds are normal.     Palpations: Abdomen is soft.     Tenderness: There is no abdominal tenderness.  Musculoskeletal:        General: No deformity. Normal range of motion.  Lymphadenopathy:     Cervical: No cervical adenopathy.  Skin:    General: Skin is warm and dry.     Findings: No lesion.  Neurological:     General: No focal deficit present.     Mental Status: She is alert and oriented to person, place, and time.  Psychiatric:        Mood and Affect: Mood normal.        Thought Content: Thought content normal.       03/16/2023    1:22 PM 01/23/2022    9:42 AM 10/21/2021    4:14 PM 08/10/2017   11:00 AM  GAD 7 : Generalized Anxiety Score  Nervous, Anxious, on Edge 0 2 3 2   Control/stop worrying 2 3 3 2   Worry too much - different things 1 2 3 3   Trouble relaxing 0 1 1 1   Restless 0 0 0 0  Easily annoyed or irritable 0 0 0 1  Afraid - awful might happen 0 1 1 0  Total GAD 7 Score 3 9 11 9   Anxiety Difficulty Not difficult at all Somewhat difficult Somewhat difficult        03/16/2023    1:22 PM 05/26/2022    8:24 AM 03/20/2022    1:18 PM  Depression screen PHQ 2/9  Decreased Interest 0 0 1  Down, Depressed, Hopeless 0 1 1  PHQ - 2 Score 0 1 2  Altered sleeping 1 0 0  Tired, decreased energy 0 1 2  Change in appetite 0 0 0  Feeling bad or failure about yourself  0 1 2  Trouble concentrating 0 0 0  Moving slowly or fidgety/restless 0 0 0  Suicidal thoughts 0 1 0  PHQ-9 Score 1 4 6   Difficult doing work/chores Not difficult at all Not difficult at all Somewhat difficult    No results found for any visits on 03/16/23.    Assessment & Plan:  Well adult exam -Age-appropriate health screenings discussed -Obtain labs -Immunizations reviewed. -Mammogram up-to-date done 08/21/2022 -Pap up-to-date, done 03/25/2022.  Followed by OB/GYN. -Colonoscopy not yet indicated -Next CPE in 1 year -     CBC with Differential/Platelet; Future -     Comprehensive  metabolic panel; Future -     Lipid panel; Future -  Hemoglobin A1c; Future -     TSH; Future -     T4, free; Future  Essential hypertension -Elevated -Recheck -Lifestyle modifications -Continue current medication including HCTZ 12.5 mg daily -Patient encouraged to obtain BP cuff and monitor at home.  For consistent elevations greater than 140/90, notify clinic and adjust medication. -     Comprehensive metabolic panel; Future -     TSH; Future -     hydroCHLOROthiazide; Take 1 tablet (12.5 mg total) by mouth daily.  Dispense: 30 tablet; Refill: 0 -     T4, free; Future  Mixed hyperlipidemia -     Lipid panel; Future  Weight gain -Body mass index is 35.67 kg/m. -Approximately 17 lb weight gain since February 2024 -Lifestyle modifications encouraged -Discussed possible causes including thyroid dysfunction, medications, electrolyte or vitamin abnormality -     CBC with Differential/Platelet; Future -     Comprehensive metabolic panel; Future -     Hemoglobin A1c; Future -     TSH; Future -     VITAMIN D 25 Hydroxy (Vit-D Deficiency, Fractures); Future -     T4, free; Future  GAD (generalized anxiety disorder) -GAD-7 score 3 -PHQ-9 score 1 -Discussed the importance of self-care -Continue Celexa 40 mg daily -Consider counseling -     TSH; Future -     Citalopram Hydrobromide; Take 1 tablet (40 mg total) by mouth daily.  Dispense: 90 tablet; Refill: 1 -     T4, free; Future    Return in about 4 months (around 07/15/2023) for blood pressure.   Deeann Saint, MD

## 2023-03-20 ENCOUNTER — Other Ambulatory Visit: Payer: Self-pay | Admitting: Family Medicine

## 2023-03-20 ENCOUNTER — Other Ambulatory Visit (HOSPITAL_COMMUNITY): Payer: Self-pay

## 2023-03-20 DIAGNOSIS — E559 Vitamin D deficiency, unspecified: Secondary | ICD-10-CM

## 2023-03-20 MED ORDER — VITAMIN D (ERGOCALCIFEROL) 1.25 MG (50000 UNIT) PO CAPS
50000.0000 [IU] | ORAL_CAPSULE | ORAL | 0 refills | Status: DC
  Filled 2023-03-20: qty 12, 84d supply, fill #0

## 2023-03-26 ENCOUNTER — Other Ambulatory Visit (HOSPITAL_COMMUNITY): Payer: Self-pay

## 2023-03-27 ENCOUNTER — Other Ambulatory Visit (HOSPITAL_COMMUNITY): Payer: Self-pay

## 2023-05-13 ENCOUNTER — Other Ambulatory Visit: Payer: Self-pay | Admitting: Family Medicine

## 2023-05-13 ENCOUNTER — Other Ambulatory Visit: Payer: Self-pay

## 2023-05-13 ENCOUNTER — Other Ambulatory Visit (HOSPITAL_COMMUNITY): Payer: Self-pay

## 2023-05-13 DIAGNOSIS — I10 Essential (primary) hypertension: Secondary | ICD-10-CM

## 2023-05-13 MED ORDER — HYDROCHLOROTHIAZIDE 12.5 MG PO TABS
12.5000 mg | ORAL_TABLET | Freq: Every day | ORAL | 2 refills | Status: AC
  Filled 2023-05-13: qty 90, 90d supply, fill #0
  Filled 2023-09-21: qty 90, 90d supply, fill #1
  Filled 2024-02-09: qty 90, 90d supply, fill #2

## 2023-09-21 ENCOUNTER — Other Ambulatory Visit: Payer: Self-pay | Admitting: Obstetrics and Gynecology

## 2023-09-22 ENCOUNTER — Other Ambulatory Visit: Payer: Self-pay

## 2023-09-28 ENCOUNTER — Other Ambulatory Visit (HOSPITAL_COMMUNITY): Payer: Self-pay

## 2023-10-05 ENCOUNTER — Other Ambulatory Visit: Payer: Self-pay | Admitting: Family Medicine

## 2023-10-05 DIAGNOSIS — Z1231 Encounter for screening mammogram for malignant neoplasm of breast: Secondary | ICD-10-CM

## 2023-11-03 ENCOUNTER — Ambulatory Visit

## 2023-11-11 ENCOUNTER — Ambulatory Visit
Admission: RE | Admit: 2023-11-11 | Discharge: 2023-11-11 | Disposition: A | Discharge status: Home / Self Care | Source: Ambulatory Visit | Attending: Family Medicine | Admitting: Family Medicine

## 2023-11-11 DIAGNOSIS — Z1231 Encounter for screening mammogram for malignant neoplasm of breast: Secondary | ICD-10-CM

## 2023-11-13 ENCOUNTER — Encounter: Payer: Self-pay | Admitting: Obstetrics and Gynecology

## 2023-12-05 ENCOUNTER — Emergency Department (HOSPITAL_COMMUNITY)
Admission: EM | Admit: 2023-12-05 | Discharge: 2023-12-05 | Disposition: A | Discharge status: Home / Self Care | Attending: Emergency Medicine | Admitting: Emergency Medicine

## 2023-12-05 ENCOUNTER — Emergency Department (HOSPITAL_COMMUNITY)

## 2023-12-05 ENCOUNTER — Encounter (HOSPITAL_COMMUNITY): Payer: Self-pay

## 2023-12-05 ENCOUNTER — Other Ambulatory Visit: Payer: Self-pay

## 2023-12-05 DIAGNOSIS — R1031 Right lower quadrant pain: Secondary | ICD-10-CM | POA: Diagnosis not present

## 2023-12-05 LAB — URINALYSIS, ROUTINE W REFLEX MICROSCOPIC
Bilirubin Urine: NEGATIVE
Glucose, UA: NEGATIVE mg/dL
Hgb urine dipstick: NEGATIVE
Ketones, ur: NEGATIVE mg/dL
Leukocytes,Ua: NEGATIVE
Nitrite: NEGATIVE
Protein, ur: NEGATIVE mg/dL
Specific Gravity, Urine: 1.021 (ref 1.005–1.030)
pH: 6 (ref 5.0–8.0)

## 2023-12-05 LAB — CBC WITH DIFFERENTIAL/PLATELET
Abs Immature Granulocytes: 0.03 K/uL (ref 0.00–0.07)
Basophils Absolute: 0 K/uL (ref 0.0–0.1)
Basophils Relative: 0 %
Eosinophils Absolute: 0.2 K/uL (ref 0.0–0.5)
Eosinophils Relative: 2 %
HCT: 39.8 % (ref 36.0–46.0)
Hemoglobin: 12.6 g/dL (ref 12.0–15.0)
Immature Granulocytes: 0 %
Lymphocytes Relative: 30 %
Lymphs Abs: 3 K/uL (ref 0.7–4.0)
MCH: 28.1 pg (ref 26.0–34.0)
MCHC: 31.7 g/dL (ref 30.0–36.0)
MCV: 88.8 fL (ref 80.0–100.0)
Monocytes Absolute: 0.7 K/uL (ref 0.1–1.0)
Monocytes Relative: 7 %
Neutro Abs: 6.2 K/uL (ref 1.7–7.7)
Neutrophils Relative %: 61 %
Platelets: 325 K/uL (ref 150–400)
RBC: 4.48 MIL/uL (ref 3.87–5.11)
RDW: 12.9 % (ref 11.5–15.5)
WBC: 10.1 K/uL (ref 4.0–10.5)
nRBC: 0 % (ref 0.0–0.2)

## 2023-12-05 LAB — COMPREHENSIVE METABOLIC PANEL WITH GFR
ALT: 25 U/L (ref 0–44)
AST: 23 U/L (ref 15–41)
Albumin: 3.4 g/dL — ABNORMAL LOW (ref 3.5–5.0)
Alkaline Phosphatase: 85 U/L (ref 38–126)
Anion gap: 8 (ref 5–15)
BUN: 8 mg/dL (ref 6–20)
CO2: 28 mmol/L (ref 22–32)
Calcium: 8.9 mg/dL (ref 8.9–10.3)
Chloride: 102 mmol/L (ref 98–111)
Creatinine, Ser: 0.89 mg/dL (ref 0.44–1.00)
GFR, Estimated: >60 mL/min (ref >60)
Glucose, Bld: 97 mg/dL (ref 70–99)
Potassium: 3.8 mmol/L (ref 3.5–5.1)
Sodium: 138 mmol/L (ref 135–145)
Total Bilirubin: 0.5 mg/dL (ref 0.0–1.2)
Total Protein: 6.8 g/dL (ref 6.5–8.1)

## 2023-12-05 LAB — PREGNANCY, URINE: Preg Test, Ur: NEGATIVE

## 2023-12-05 LAB — LIPASE, BLOOD: Lipase: 34 U/L (ref 11–51)

## 2023-12-05 MED ORDER — FENTANYL CITRATE PF 50 MCG/ML IJ SOSY
25.0000 ug | PREFILLED_SYRINGE | Freq: Once | INTRAMUSCULAR | Status: AC
  Administered 2023-12-05: 25 ug via INTRAVENOUS
  Filled 2023-12-05: qty 1

## 2023-12-05 MED ORDER — FENTANYL CITRATE PF 50 MCG/ML IJ SOSY
50.0000 ug | PREFILLED_SYRINGE | Freq: Once | INTRAMUSCULAR | Status: AC
  Administered 2023-12-05: 50 ug via INTRAVENOUS
  Filled 2023-12-05: qty 1

## 2023-12-05 MED ORDER — IOHEXOL 300 MG/ML  SOLN
100.0000 mL | Freq: Once | INTRAMUSCULAR | Status: AC | PRN
  Administered 2023-12-05: 100 mL via INTRAVENOUS

## 2023-12-05 MED ORDER — FENTANYL CITRATE PF 50 MCG/ML IJ SOSY: 25.0000 ug | PREFILLED_SYRINGE | Freq: Once | INTRAMUSCULAR | Status: DC

## 2023-12-05 NOTE — Discharge Instructions (Signed)
 You will need to follow-up with your primary care provider.  Seek emergency care if experiencing any new or worsening symptoms.  Alternating between 650 mg Tylenol  and 400 mg Advil: The best way to alternate taking Acetaminophen  (example Tylenol ) and Ibuprofen (example Advil/Motrin) is to take them 3 hours apart. For example, if you take ibuprofen at 6 am you can then take Tylenol  at 9 am. You can continue this regimen throughout the day, making sure you do not exceed the recommended maximum dose for each drug.

## 2023-12-05 NOTE — ED Provider Notes (Signed)
 [place] EMERGENCY DEPARTMENT AT [facility] Provider Note   CSN: [id number] Arrival date & time: 12/05/23  0300     Patient presents with: Pelvic Pain  Sherry [name] is a 43 y.o. female who presents with concern for right-sided pelvic pain times a week, radiates into her groin, has history of fibroids.  Denies any vaginal bleeding, nausea vomiting discharge or diarrhea.  Denies urinary symptoms.  Hx of HPV.    HPI     Prior to Admission medications   Medication Sig Start Date End Date Taking? Authorizing Provider  Acetaminophen -Caff-Pyrilamine  500-60-15 MG TABS Take 2 tablets by mouth every 8 (eight) hours as needed (cramping). No more than 6 tablets per day 04/14/19   Wieters, Hallie C, PA-C  citalopram  (CELEXA ) 40 MG tablet Take 1 tablet (40 mg total) by mouth daily. 03/16/23   Mercer Clotilda SAUNDERS, MD  hydrochlorothiazide  (HYDRODIURIL ) 12.5 MG tablet Take 1 tablet (12.5 mg total) by mouth daily. 05/13/23   Mercer Clotilda SAUNDERS, MD  Multiple Vitamins-Minerals (ALIVE WOMENS GUMMY) CHEW Chew 1 each by mouth daily.    [provider]  norethindrone  (MICRONOR ) 0.35 MG tablet Take 1 tablet (0.35 mg total) by mouth daily. 05/26/22   Constant, Peggy, MD  Vitamin D , Ergocalciferol , (DRISDOL ) 1.25 MG (50000 UNIT) CAPS capsule Take 1 capsule (50,000 Units total) by mouth every 7 (seven) days. 03/20/23   Mercer Clotilda SAUNDERS, MD    Allergies: Patient has no known allergies.    Review of Systems  Gastrointestinal: Negative.   Genitourinary:  Positive for pelvic pain. Negative for decreased urine volume, flank pain, frequency, urgency, vaginal bleeding, vaginal discharge and vaginal pain.    Updated Vital Signs BP (!) 135/94   Pulse 63   Temp 97.9 F (36.6 C) (Oral)   Resp 18   Wt 100.2 kg   SpO2 98%   BMI 35.67 kg/m   Physical Exam Vitals and nursing note reviewed.  Constitutional:      Appearance: She is obese. She is not ill-appearing or toxic-appearing.  HENT:      Head: Normocephalic and atraumatic.     Mouth/Throat:     Mouth: Mucous membranes are moist.     Pharynx: No oropharyngeal exudate or posterior oropharyngeal erythema.  Eyes:     General:        Right eye: No discharge.        Left eye: No discharge.     Extraocular Movements: Extraocular movements intact.     Conjunctiva/sclera: Conjunctivae normal.     Pupils: Pupils are equal, round, and reactive to light.  Cardiovascular:     Rate and Rhythm: Normal rate and regular rhythm.     Pulses: Normal pulses.     Heart sounds: Normal heart sounds. No murmur heard. Pulmonary:     Effort: Pulmonary effort is normal. No respiratory distress.     Breath sounds: Normal breath sounds. No wheezing or rales.  Abdominal:     General: Bowel sounds are normal. There is no distension.     Palpations: Abdomen is soft.     Tenderness: There is no abdominal tenderness. There is no guarding or rebound.  Musculoskeletal:        General: No deformity.     Cervical back: Neck supple.     Right lower leg: No edema.     Left lower leg: No edema.  Skin:    General: Skin is warm and dry.     Capillary Refill: Capillary  refill takes less than 2 seconds.  Neurological:     General: No focal deficit present.     Mental Status: She is alert. Mental status is at baseline.  Psychiatric:        Mood and Affect: Mood normal.     (all labs ordered are listed, but only abnormal results are displayed) Labs Reviewed  COMPREHENSIVE METABOLIC PANEL WITH GFR - Abnormal; Notable for the following components:      Result Value   Albumin 3.4 (*)    All other components within normal limits  CBC WITH DIFFERENTIAL/PLATELET  LIPASE, BLOOD  URINALYSIS, ROUTINE W REFLEX MICROSCOPIC  PREGNANCY, URINE    EKG: None  Radiology: CT ABDOMEN PELVIS W CONTRAST Result Date: 12/05/2023 CLINICAL DATA:  Right lower quadrant abdominal pain. EXAM: CT ABDOMEN AND PELVIS WITH CONTRAST TECHNIQUE: Multidetector CT imaging of  the abdomen and pelvis was performed using the standard protocol following bolus administration of intravenous contrast. RADIATION DOSE REDUCTION: This exam was performed according to the departmental dose-optimization program which includes automated exposure control, adjustment of the mA and/or kV according to patient size and/or use of iterative reconstruction technique. CONTRAST:  OMNIPAQUE  IOHEXOL  300 MG/ML  SOLN COMPARISON:  None Available. FINDINGS: Lower chest: No acute findings. Hepatobiliary: No suspicious focal abnormality within the liver parenchyma. There is no evidence for gallstones, gallbladder wall thickening, or pericholecystic fluid. No intrahepatic or extrahepatic biliary dilation. Pancreas: No focal mass lesion. No dilatation of the main duct. No intraparenchymal cyst. No peripancreatic edema. Spleen: No splenomegaly. No suspicious focal mass lesion. Adrenals/Urinary Tract: No adrenal nodule or mass. Kidneys unremarkable. No evidence for hydroureter. The urinary bladder appears normal for the degree of distention. Stomach/Bowel: Stomach is unremarkable. No gastric wall thickening. No evidence of outlet obstruction. Duodenum is normally positioned as is the ligament of Treitz. No small bowel wall thickening. No small bowel dilatation. The terminal ileum is normal. The appendix is normal. No gross colonic mass. No colonic wall thickening. Vascular/Lymphatic: No abdominal aortic aneurysm. No abdominal aortic atherosclerotic calcification. There is no gastrohepatic or hepatoduodenal ligament lymphadenopathy. No retroperitoneal or mesenteric lymphadenopathy. No pelvic sidewall lymphadenopathy. Reproductive: Heterogeneous enhancement of the uterus with several focal areas of hyperenhancement in the myometrium. Features could be related to fibroid disease or adenomyosis. Dominant follicle noted right ovary. There is no adnexal mass. Other: Trace free fluid is identified in the anterior right  pelvis. Musculoskeletal: No worrisome lytic or sclerotic osseous abnormality. Marked rectus diastasis with midline fascial laxity. IMPRESSION: 1. No acute findings in the abdomen or pelvis. Specifically, no findings to explain the patient's history of right lower quadrant pain. The appendix and terminal ileum are normal. No adnexal mass. 2. Heterogeneous enhancement of the uterus with several focal areas of hyperenhancement in the myometrium. Features could be related to fibroid disease or adenomyosis. Pelvic ultrasound could be used to further evaluate as clinically warranted. Ultimately, MRI of the pelvis with and without contrast would be the study of choice to further evaluate. This could be deferred to be performed as an outpatient elective basis. 3. Trace free fluid in the anterior right pelvis, likely physiologic. Electronically Signed   By: Camellia Candle M.D.   On: 12/05/2023 07:03     Procedures   Medications Ordered in the ED  fentaNYL  (SUBLIMAZE ) injection 50 mcg (50 mcg Intravenous Given 12/05/23 0453)  iohexol  (OMNIPAQUE ) 300 MG/ML solution 100 mL (100 mLs Intravenous Contrast Given 12/05/23 0639)  Medical Decision Making 43 year old female who presents with concern for right sided lower quadrant pain.  Hypertensive on intake ultrasound is normal.  Right lower quadrant tenderness palpation without rebound or guarding.  No CVAT.   Amount and/or Complexity of Data Reviewed Labs: ordered.    Details: CBC without leukocytosis or anemia, CMP unremarkable, lipase is normal, UA unremarkable and preached is negative. Radiology: ordered.  Risk Prescription drug management.   Care of this patient signed out to oncoming ED provider CANDIE Mays, PA-C at time of shift change. All pertinent HPI, physical exam, and laboratory findings were discussed with them prior to my departure. Disposition of patient pending completion of workup, reevaluation, and  clinical judgement of oncoming ED provider.   This chart was dictated using voice recognition software, Dragon. Despite the best efforts of this provider to proofread and correct errors, errors may still occur which can change documentation meaning.     Final diagnoses:  None    ED Discharge Orders     None          Bobette Pleasant JONELLE DEVONNA 12/05/23 0740    Trine Raynell Moder, MD 12/05/23 [phone]

## 2023-12-05 NOTE — ED Provider Notes (Signed)
   Accepted handoff at shift change from Sponseller PA-C. Please see prior provider note for more detail.   Briefly: Patient is 43 y.o. concern for right-sided pelvic pain times a week, radiates into her groin, has history of fibroids. Denies any vaginal bleeding, nausea vomiting discharge or diarrhea. Denies urinary symptoms.  DDX: concern for appendicitis, SBO, gastroenteritis, MSK, ovarian torsion, tubo-ovarian abscess, etc.  Plan:  - dispo pending imaging.  - UPT negative.  UA not concerning for infection.  CBC without leukocytosis or anemia.  CMP reassuring.  Lipase within normal limits. - US  pelvis and CT abdomen/pelvis without acute findings.  The scans are showing fibroids vs adenomyosis.  Patient stating that she is already aware of this condition and follows with a GYN provider for this. - Patient feeling better.  Patient ready to go home.  Patient to follow-up with PCP.   - Patient afebrile with stable vitals.  Provided with return precautions.  Discharged in good condition.      Hoy Nidia FALCON, [place] 12/05/23 [id number]    Trine Raynell Moder, MD 12/06/23 [phone]

## 2023-12-05 NOTE — ED Triage Notes (Signed)
 Pt arrives with c/o right sided pelvic pain. Pt reports that pain radiates into her groin. Pt has hx of fibroids. Pt denies vaginal bleeding.

## 2023-12-08 NOTE — Unmapped External Note (Signed)
 Special OR Call completed to review [facility] requirements. - No answer, LVMM with CB info.

## 2023-12-10 ENCOUNTER — Ambulatory Visit (INDEPENDENT_AMBULATORY_CARE_PROVIDER_SITE_OTHER): Admitting: Certified Nurse Midwife

## 2023-12-10 VITALS — BP 133/92 | HR 89 | Ht 66.0 in | Wt 217.0 lb

## 2023-12-10 DIAGNOSIS — N946 Dysmenorrhea, unspecified: Secondary | ICD-10-CM

## 2023-12-10 DIAGNOSIS — N8003 Adenomyosis of the uterus: Secondary | ICD-10-CM

## 2023-12-10 MED ORDER — NAPROXEN 500 MG PO TABS: 500.0000 mg | ORAL_TABLET | Freq: Two times a day (BID) | ORAL | 3 refills | Status: AC

## 2023-12-10 NOTE — Progress Notes (Signed)
 Forgets to take BCPs. Wants to discuss other methods. BTL with last C-Section. Taking Ocs for adnexal pain. Pelvic U/S on 12/05/2023 for the pain. States fibroids.

## 2023-12-10 NOTE — Progress Notes (Addendum)
   GYNECOLOGY PROGRESS NOTE  History:  43 y.o. [id number] presents to [facility] office today for problem gyn visit. She reports having severe pain days before her cycle, during her cycle, and sometimes after her cycle. Has tried taking motrin  for the pain with some relief, but not a lot. Reports that periods are 4-5 days and that she changes her pads 3 times per day. Reports that the bleeding is not her concern. POPs did not change anything with her bleeding. Bleeding has been the same since before starting POPs. States that she is not consistent with taking the POPs and would like to discuss other options for pain management. She denies h/a, dizziness, shortness of breath, n/v, or fever/chills.    The following portions of the patient's history were reviewed and updated as appropriate: allergies, current medications, past family history, past medical history, past social history, past surgical history and problem list. Last pap smear on 03/20/22 was normal, negative HRHPV.  Health Maintenance Due  Topic Date Due   Hepatitis B Vaccines 19-59 Average Risk (1 of 3 - 19+ 3-dose series) Never done   HPV VACCINES (1 - 3-dose SCDM series) Never done   COVID-19 Vaccine (3 - 2024-25 season) 12/14/2022   INFLUENZA VACCINE  11/13/2023     Review of Systems:  Pertinent items are noted in HPI.   Objective:  Physical Exam Blood pressure (!) 133/92, pulse 89, height 5' 6 (1.676 m), weight 98.4 kg, last menstrual period 11/23/2023. VS reviewed, nursing note reviewed,  Constitutional: well developed, well nourished, no distress HEENT: normocephalic CV: normal rate Pulm/chest wall: normal effort Breast Exam: deferred Abdomen: soft Neuro: alert and oriented x 3 Skin: warm, dry Psych: affect normal Pelvic exam: Deferred Bimanual exam: Deferred  Assessment & Plan:  1. Dysmenorrhea (Primary) - naproxen  (NAPROSYN ) 500 MG tablet; Take 1 tablet (500 mg total) by mouth 2 (two) times daily with a meal.   Dispense: 60 tablet; Refill: 3  2. Adenomyosis - naproxen  (NAPROSYN ) 500 MG tablet; Take 1 tablet (500 mg total) by mouth 2 (two) times daily with a meal.  Dispense: 60 tablet; Refill: 3   Patient counseled extensively on Mirena IUD being the best treatment options for Adenomyosis. Also discussed NSAID therapy taking Naproxen  BID 2 days prior to the start of her cycle and throughout the duration of her cycle. Patient declined IUD, but agreeable to starting Naproxen  for pain management. Follow up in 6 months if pain is not improved.  Derrek JINNY Freund, FNP Student 4:54 PM

## 2024-02-09 ENCOUNTER — Other Ambulatory Visit: Payer: Self-pay | Admitting: Family Medicine

## 2024-02-09 DIAGNOSIS — F411 Generalized anxiety disorder: Secondary | ICD-10-CM

## 2024-02-10 ENCOUNTER — Other Ambulatory Visit: Payer: Self-pay

## 2024-02-16 ENCOUNTER — Other Ambulatory Visit (HOSPITAL_COMMUNITY): Payer: Self-pay

## 2024-03-17 ENCOUNTER — Ambulatory Visit: Admitting: Family Medicine

## 2024-03-17 ENCOUNTER — Other Ambulatory Visit (HOSPITAL_COMMUNITY): Payer: Self-pay

## 2024-03-17 ENCOUNTER — Encounter: Payer: Self-pay | Admitting: Family Medicine

## 2024-03-17 VITALS — BP 124/86 | HR 95 | Temp 97.8°F | Wt 221.4 lb

## 2024-03-17 DIAGNOSIS — N951 Menopausal and female climacteric states: Secondary | ICD-10-CM

## 2024-03-17 DIAGNOSIS — E559 Vitamin D deficiency, unspecified: Secondary | ICD-10-CM | POA: Diagnosis not present

## 2024-03-17 DIAGNOSIS — I10 Essential (primary) hypertension: Secondary | ICD-10-CM

## 2024-03-17 DIAGNOSIS — Z0000 Encounter for general adult medical examination without abnormal findings: Secondary | ICD-10-CM | POA: Diagnosis not present

## 2024-03-17 DIAGNOSIS — F411 Generalized anxiety disorder: Secondary | ICD-10-CM | POA: Diagnosis not present

## 2024-03-17 DIAGNOSIS — L819 Disorder of pigmentation, unspecified: Secondary | ICD-10-CM | POA: Diagnosis not present

## 2024-03-17 DIAGNOSIS — R42 Dizziness and giddiness: Secondary | ICD-10-CM

## 2024-03-17 DIAGNOSIS — F339 Major depressive disorder, recurrent, unspecified: Secondary | ICD-10-CM

## 2024-03-17 DIAGNOSIS — R635 Abnormal weight gain: Secondary | ICD-10-CM

## 2024-03-17 DIAGNOSIS — E782 Mixed hyperlipidemia: Secondary | ICD-10-CM | POA: Diagnosis not present

## 2024-03-17 MED ORDER — CITALOPRAM HYDROBROMIDE 20 MG PO TABS
20.0000 mg | ORAL_TABLET | Freq: Every day | ORAL | 0 refills | Status: AC
  Filled 2024-03-17: qty 90, 90d supply, fill #0

## 2024-03-17 NOTE — Progress Notes (Signed)
 "  Established Patient Office Visit   Subjective  Patient ID: Sherry [name], female    DOB: 01-14-1981  Age: 43 y.o. MRN: [id number]  Chief Complaint  Patient presents with   Annual Exam    Patient is having dizzy spells and hot flashes    Pt is a 43 yo female seen for CPE and several concerns.  Pt with hot flashes, increased irritability, irregular menses, wt gain.  Off celexa  40 mg x several months.  Interested in restarting.  Pt had episodes of dizziness.  Improved with increasing water intake.  Also having gas, bloating, constipation.  Pt noticing darkened spots on L shoulder.  Non pruritic, not painful, no bumps presently.    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   Depression, recurrent 01/22/2017   Abnormal uterine bleeding (AUB) 06/27/2015   Severe anemia 06/06/2015   Symptomatic anemia 06/06/2015   History of cesarean section 04/03/2014   ASCUS with positive high risk HPV 10/19/2012   Yeast dermatitis 10/19/2012   Past Medical History:  Diagnosis Date   Abnormal uterine bleeding (AUB) 06/27/2015   Anxiety    Blood transfusion without reported diagnosis    Depression    GERD (gastroesophageal reflux disease)    with pregnancy   H/O chlamydia infection    Hypertension    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   Severe anemia 06/06/2015   Shortness of breath dyspnea    with exertion   Past Surgical History:  Procedure Laterality Date   BARTHOLIN CYST MARSUPIALIZATION     CESAREAN SECTION     x2   CESAREAN SECTION WITH BILATERAL TUBAL LIGATION N/A 04/03/2014   Procedure: CESAREAN SECTION WITH BILATERAL TUBAL LIGATION;  Surgeon: Jolene Gaskins, MD;  Location: [facility];  Service: Obstetrics;  Laterality: N/A;   DENTAL SURGERY  04/14/2008   6 teeth surgically removed   HYSTEROSCOPY WITH NOVASURE N/A 06/27/2015   Procedure: HYSTEROSCOPY  WITH NOVASURE;  Surgeon: Elveria Mungo, MD;  Location: [facility];  Service: Gynecology;  Laterality: N/A;   TUBAL LIGATION     Social History   Tobacco Use   Smoking status: Never   Smokeless tobacco: Never  Substance Use Topics   Alcohol use: Yes    Comment: socially   Drug use: Never   Family History  Problem Relation Age of Onset   Diabetes Mother        Type 2   Hypertension Mother    Depression Father    Diabetes Maternal Aunt    Hypertension Maternal Aunt    Hypertension Maternal Aunt    Depression Maternal Grandfather    No Known Allergies  ROS Negative unless stated above    Objective:     BP 124/86 (BP Location: Left Arm, Patient Position: Sitting, Cuff Size: Large)   Pulse 95   Temp 97.8 F (36.6 C) (Oral)   Wt 221 lb 6.4 oz (100.4 kg)   SpO2 98%   BMI 35.73 kg/m  BP Readings from Last 3 Encounters:  03/17/24 124/86  12/10/23 (!) 133/92  12/05/23 (!) 147/93   Wt Readings from Last 3 Encounters:  03/17/24 221 lb 6.4 oz (100.4 kg)  12/10/23 217 lb (98.4 kg)  12/05/23 221 lb (100.2 kg)      Physical Exam Constitutional:      Appearance: Normal appearance.  HENT:     Head: Normocephalic and atraumatic.  Right Ear: Tympanic membrane, ear canal and external ear normal.     Left Ear: Tympanic membrane, ear canal and external ear normal.     Nose: Nose normal.     Mouth/Throat:     Mouth: Mucous membranes are moist.     Pharynx: No oropharyngeal exudate or posterior oropharyngeal erythema.  Eyes:     General: No scleral icterus.    Extraocular Movements: Extraocular movements intact.     Conjunctiva/sclera: Conjunctivae normal.     Pupils: Pupils are equal, round, and reactive to light.  Neck:     Thyroid : No thyromegaly.     Vascular: No carotid bruit.  Cardiovascular:     Rate and Rhythm: Normal rate and regular rhythm.     Pulses: Normal pulses.     Heart sounds: Normal heart sounds. No murmur heard.    No friction rub.   Pulmonary:     Effort: Pulmonary effort is normal.     Breath sounds: Normal breath sounds. No wheezing, rhonchi or rales.  Abdominal:     General: Bowel sounds are normal.     Palpations: Abdomen is soft.     Tenderness: There is no abdominal tenderness.  Musculoskeletal:        General: No deformity. Normal range of motion.  Lymphadenopathy:     Cervical: No cervical adenopathy.  Skin:    General: Skin is warm and dry.     Findings: No lesion.     Comments: Hyperpigmentation on posterior left shoulder  Neurological:     General: No focal deficit present.     Mental Status: She is alert and oriented to person, place, and time.  Psychiatric:        Mood and Affect: Mood normal.        Thought Content: Thought content normal.        03/17/2024    3:04 PM 03/16/2023    1:22 PM 05/26/2022    8:24 AM  Depression screen PHQ 2/9  Decreased Interest 0 0 0  Down, Depressed, Hopeless 2 0 1  PHQ - 2 Score 2 0 1  Altered sleeping 3 1 0  Tired, decreased energy 2 0 1  Change in appetite 0 0 0  Feeling bad or failure about yourself  1 0 1  Trouble concentrating 0 0 0  Moving slowly or fidgety/restless 0 0 0  Suicidal thoughts 1 0 1  PHQ-9 Score 9 1  4    Difficult doing work/chores Somewhat difficult Not difficult at all Not difficult at all     Data saved with a previous flowsheet row definition      03/17/2024    3:05 PM 03/16/2023    1:22 PM 01/23/2022    9:42 AM 10/21/2021    4:14 PM  GAD 7 : Generalized Anxiety Score  Nervous, Anxious, on Edge 2 0 2 3  Control/stop worrying 3 2 3 3   Worry too much - different things 3 1 2 3   Trouble relaxing 2 0 1 1  Restless 0 0 0 0  Easily annoyed or irritable 3 0 0 0  Afraid - awful might happen 1 0 1 1  Total GAD 7 Score 14 3 9 11   Anxiety Difficulty Somewhat difficult Not difficult at all Somewhat difficult Somewhat difficult     No results found for any visits on 03/17/24.    Assessment & Plan:   Well adult exam -      CBC with Differential/Platelet; Future -  Comprehensive metabolic panel with GFR; Future -     Hemoglobin A1c; Future -     Lipid panel; Future -     T4, free; Future -     TSH; Future  GAD (generalized anxiety disorder)  Essential hypertension  Vitamin D  deficiency -     VITAMIN D  25 Hydroxy (Vit-D Deficiency, Fractures); Future  Weight gain -     T4, free; Future -     TSH; Future -     VITAMIN D  25 Hydroxy (Vit-D Deficiency, Fractures); Future  Mixed hyperlipidemia -     Lipid panel; Future  Depression, recurrent -     T4, free; Future -     TSH; Future -     Citalopram  Hydrobromide; Take 1 tablet (20 mg total) by mouth daily.  Dispense: 90 tablet; Refill: 0  Dizziness -     CBC with Differential/Platelet; Future -     Comprehensive metabolic panel with GFR; Future -     Hemoglobin A1c; Future -     T4, free; Future -     TSH; Future  Perimenopause  Hyperpigmentation   Age-appropriate health screenings discussed.  Obtain labs.  Immunizations reviewed.  Mammogram done 11/11/2023.  Colonoscopy not yet indicated 2/2 age.  BP controlled.  Continue HCTZ 12.5 mg daily and lifestyle modifications.  PHQ-9 score 9, GAD-7 score 14 this visit.  Restart Celexa  20 mg, increase to 40 mg if needed in the next few wks.  Perimenopausal symptoms.  Discussed treatment options including OTC meds for symptom treatment and HRT.  Will try OTC meds.  Lifestyle modifications encouraged including diet and exercise to help with weight gain, HLD as prior LDL 111, HDL 32.2 on 03/16/2023.  Discussed the importance of hydration and eating regular meals to prevent dizziness.  Hyperpigmentation of shoulder likely 2/2 postinflammatory response. Discussed treatment options including time, OTC vitamin E oil,  and fade creams.  Return in about 3 months (around 06/15/2024).   Sherry JONELLE Single, MD "

## 2024-03-17 NOTE — Patient Instructions (Signed)
 A prescription for Celexa  20 mg was sent to your pharmacy.  We can increase the dose if needed next month since you have been off of the medication for a while.  Notify clinic before you run out for additional refills or for the increased dose.

## 2024-03-18 LAB — LIPID PANEL
Cholesterol: 172 mg/dL (ref 0–200)
HDL: 35.6 mg/dL — ABNORMAL LOW (ref >39.00)
LDL Cholesterol: 96 mg/dL (ref 0–99)
NonHDL: 136.41
Total CHOL/HDL Ratio: 5
Triglycerides: 201 mg/dL — ABNORMAL HIGH (ref 0.0–149.0)
VLDL: 40.2 mg/dL — ABNORMAL HIGH (ref 0.0–40.0)

## 2024-03-18 LAB — CBC WITH DIFFERENTIAL/PLATELET
Basophils Absolute: 0.1 K/uL (ref 0.0–0.1)
Basophils Relative: 1.2 % (ref 0.0–3.0)
Eosinophils Absolute: 0.2 K/uL (ref 0.0–0.7)
Eosinophils Relative: 2.7 % (ref 0.0–5.0)
HCT: 40.9 % (ref 36.0–46.0)
Hemoglobin: 14 g/dL (ref 12.0–15.0)
Lymphocytes Relative: 37.4 % (ref 12.0–46.0)
Lymphs Abs: 3.1 K/uL (ref 0.7–4.0)
MCHC: 34.1 g/dL (ref 30.0–36.0)
MCV: 87 fl (ref 78.0–100.0)
Monocytes Absolute: 0.5 K/uL (ref 0.1–1.0)
Monocytes Relative: 5.5 % (ref 3.0–12.0)
Neutro Abs: 4.5 K/uL (ref 1.4–7.7)
Neutrophils Relative %: 53.2 % (ref 43.0–77.0)
Platelets: 298 K/uL (ref 150.0–400.0)
RBC: 4.71 Mil/uL (ref 3.87–5.11)
RDW: 13.1 % (ref 11.5–15.5)
WBC: 8.4 K/uL (ref 4.0–10.5)

## 2024-03-18 LAB — COMPREHENSIVE METABOLIC PANEL WITH GFR
ALT: 49 U/L — ABNORMAL HIGH (ref 0–35)
AST: 45 U/L — ABNORMAL HIGH (ref 0–37)
Albumin: 4.3 g/dL (ref 3.5–5.2)
Alkaline Phosphatase: 82 U/L (ref 39–117)
BUN: 11 mg/dL (ref 6–23)
CO2: 33 meq/L — ABNORMAL HIGH (ref 19–32)
Calcium: 9.3 mg/dL (ref 8.4–10.5)
Chloride: 100 meq/L (ref 96–112)
Creatinine, Ser: 0.91 mg/dL (ref 0.40–1.20)
GFR: 78.24 mL/min (ref >60.00)
Glucose, Bld: 83 mg/dL (ref 70–99)
Potassium: 3.9 meq/L (ref 3.5–5.1)
Sodium: 140 meq/L (ref 135–145)
Total Bilirubin: 0.4 mg/dL (ref 0.2–1.2)
Total Protein: 7.3 g/dL (ref 6.0–8.3)

## 2024-03-18 LAB — HEMOGLOBIN A1C: Hgb A1c MFr Bld: 5.6 % (ref 4.6–6.5)

## 2024-03-18 LAB — TSH: TSH: 2.99 u[IU]/mL (ref 0.35–5.50)

## 2024-03-18 LAB — VITAMIN D 25 HYDROXY (VIT D DEFICIENCY, FRACTURES): VITD: 23.01 ng/mL — ABNORMAL LOW (ref 30.00–100.00)

## 2024-03-18 LAB — T4, FREE: Free T4: 0.66 ng/dL (ref 0.60–1.60)

## 2024-03-26 ENCOUNTER — Ambulatory Visit: Payer: Self-pay | Admitting: Family Medicine

## 2024-03-26 ENCOUNTER — Other Ambulatory Visit (HOSPITAL_COMMUNITY): Payer: Self-pay

## 2024-03-26 DIAGNOSIS — E559 Vitamin D deficiency, unspecified: Secondary | ICD-10-CM

## 2024-03-26 MED ORDER — VITAMIN D (ERGOCALCIFEROL) 1.25 MG (50000 UNIT) PO CAPS
50000.0000 [IU] | ORAL_CAPSULE | ORAL | 0 refills | Status: AC
  Filled 2024-03-26: qty 12, 84d supply, fill #0

## 2024-03-28 ENCOUNTER — Other Ambulatory Visit (HOSPITAL_COMMUNITY): Payer: Self-pay

## 2024-06-15 ENCOUNTER — Ambulatory Visit: Admitting: Family Medicine
